# Patient Record
Sex: Female | Born: 1940 | Race: White | Hispanic: No | Marital: Married | Smoking: Never smoker
Health system: Southern US, Community
[De-identification: ages and names within clinical notes are randomized; demographics above are authoritative.]

## PROBLEM LIST (undated history)

## (undated) DIAGNOSIS — K219 Gastro-esophageal reflux disease without esophagitis: Secondary | ICD-10-CM

## (undated) DIAGNOSIS — I639 Cerebral infarction, unspecified: Secondary | ICD-10-CM

## (undated) DIAGNOSIS — Z86718 Personal history of other venous thrombosis and embolism: Secondary | ICD-10-CM

## (undated) DIAGNOSIS — I10 Essential (primary) hypertension: Secondary | ICD-10-CM

## (undated) DIAGNOSIS — M1990 Unspecified osteoarthritis, unspecified site: Secondary | ICD-10-CM

## (undated) HISTORY — PX: CYSTOSCOPY WITH STENT PLACEMENT: SHX5790

## (undated) HISTORY — PX: JOINT REPLACEMENT: SHX530

## (undated) HISTORY — PX: TONSILLECTOMY: SUR1361

## (undated) HISTORY — PX: OTHER SURGICAL HISTORY: SHX169

---

## 2006-11-04 ENCOUNTER — Inpatient Hospital Stay (HOSPITAL_COMMUNITY): Admission: RE | Admit: 2006-11-04 | Discharge: 2006-11-06 | Payer: Self-pay | Admitting: Orthopedic Surgery

## 2007-01-07 ENCOUNTER — Ambulatory Visit (HOSPITAL_COMMUNITY): Admission: RE | Admit: 2007-01-07 | Discharge: 2007-01-08 | Payer: Self-pay | Admitting: Orthopedic Surgery

## 2007-08-18 ENCOUNTER — Ambulatory Visit (HOSPITAL_COMMUNITY): Admission: RE | Admit: 2007-08-18 | Discharge: 2007-08-18 | Payer: Self-pay | Admitting: Orthopedic Surgery

## 2007-08-18 ENCOUNTER — Ambulatory Visit: Payer: Self-pay | Admitting: Surgery

## 2007-08-18 ENCOUNTER — Encounter (INDEPENDENT_AMBULATORY_CARE_PROVIDER_SITE_OTHER): Payer: Self-pay | Admitting: Orthopedic Surgery

## 2010-11-21 NOTE — Op Note (Signed)
NAMEMIEKA, Kari Flores              ACCOUNT NO.:  [id number]   MEDICAL RECORD NO.:  [id number]          PATIENT TYPE:  OIB   LOCATION:  0098                         FACILITY:  [facility]   PHYSICIAN:  Madlyn Frankel. Charlann Boxer, M.D.  DATE OF BIRTH:  1940/11/08   DATE OF PROCEDURE:  01/07/2007  DATE OF DISCHARGE:                               OPERATIVE REPORT   PREOPERATIVE DIAGNOSIS:  Stiff, painful, right total knee revision.   POSTOPERATIVE DIAGNOSIS:  Stiff, painful, right total knee revision.   OPERATION PERFORMED:  1. Examination under anesthesia indicating range of motion of 15 to 95      degrees.  2. Manipulation under anesthesia, right total knee replacement with a      post manipulation range of motion of 5 to 130, fairly easily.      Attempts at terminal extension not possible.   SURGEON:  Madlyn Frankel. Charlann Boxer, M.D.   ASSISTANT:  None.   ANESTHESIA:  Regional femoral block plus MAC,   COMPLICATIONS:  None.   INDICATIONS FOR PROCEDURE:  [name] is a very pleasant 70 year old  female who is now status post a revision, right total knee replacement  for diagnosis of stiffness.  Based on this significant extension issue  she had before, she underwent revision surgery.  At the time of surgery,  she was known have full knee extension and easy flexion with  manipulation.  Unfortunately in the postoperative period here she has  regained her postoperative flexion contracture as well as the inability  to fully flex the knee.  We reviewed the risks and benefits of this  current issue given the fact that she has been very diligent with her  home therapy and therapy on the outside.  The risks and benefits  discussed, consent obtained.   DESCRIPTION OF PROCEDURE:  The patient was brought to the operating  theater.  Once adequate anesthesia was established, the patient was  positioned on the operating table.  Examination under anesthesia  revealed the above findings.  Following this, I  manipulated the knee  first attempts in extension.  With the thigh placed in the supracondylar  region, I then used pressure on the calf in upward fashion.  I was able  to hear some lysis of adhesions posteriorly, was not significant.  I was  able to extend her to about lacking 5 degrees.  I then brought her hip  up to 90 degrees of flexion and with my hands just on the proximal and  distal aspect of her knee.  I manipulated her knee easily flexing  through scar tissue to 130 degrees.  The last bit of flexion 140 degrees  was not possible.  No other attempts now at terminal extension were  being made, I was to concerned of complications of fracture to push it  much further.  Procedure was ended at this point.  Patient was then  awakened from her anesthetic and brought to recovery room in stable  condition.  She will stay in the hospital overnight, when CPM is maxed  out and attend therapy tomorrow.  Medication is prescribed.  [name], M.D.  Electronically Signed     MDO/MEDQ  D:  01/07/2007  T:  01/08/2007  Job:  [id number]

## 2010-11-24 NOTE — Discharge Summary (Signed)
Kari Flores, Kari Flores              ACCOUNT NO.:  [id number]   MEDICAL RECORD NO.:  [id number]          PATIENT TYPE:  INP   LOCATION:  1615                         FACILITY:  [facility]   PHYSICIAN:  Madlyn Frankel. Charlann Boxer, M.D.  DATE OF BIRTH:  [birth year]   DATE OF ADMISSION:  11/04/2006  DATE OF DISCHARGE:  11/06/2006                               DISCHARGE SUMMARY   ADMITTING DIAGNOSES:  1. Osteoarthritis.  2. Status post right total knee replacement.  3. Deep venous thrombosis.  4. Hypertension.  5. Lacunar stroke in November2006.   DISCHARGE DIAGNOSES:  1. Osteoarthritis.  2. Status post right total knee replacement.  3. Deep venous thrombosis.  4. Hypertension.  5. Lacunar stroke in November2006.  6. Revision right total knee replacement with polyethylene exchange.   CONSULTS:  None.   PROCEDURE:  Revision right total knee polyethylene exchange with scar  debridement.   SURGEON:  Dr. Durene Romans.   ASSISTANT:  Dwyane Luo, PA-C.   HISTORY OF PRESENT ILLNESS:  [name] is a 70 year old female who  had a right total knee replacement in July2007, had subsequent  flexion contracture unresolved with physical therapy.  She walked with a  limp and had diminished quality of life.  She upon x-ray evaluation and  clinical evaluation was determined that a polyethylene exchange and  patella revision would help with motion of her right knee.   LABS:  Preadmission CBC showed hematocrit 41.1.  Postop day #1, 32.1.  Postop day #2, 30.6.   Preadmission coagulation INR 0.9.   Preadmission chemistries:  Sodium 142, potassium 4.4, glucose 101  tracked throughout.  No significant trending with glucose as postop day  #2 was at 106.   Preadmission GFR greater than 60, remained greater than 60 throughout.   Preadmission GI chemistries all within normal limits.   Preadmission UA showed trace hemoglobin.   EKG showed normal sinus rhythm.   RADIOLOGY:  Chest two-view no active  disease.   HOSPITAL COURSE:  The patient underwent revision surgery, tolerated the  procedure well, and was admitted to the orthopedic floor.  Pain was well  controlled throughout.  She remained afebrile throughout.  Her dressing  was changed postop day #2.  It was clean, dry, and intact.  She remained  neuromuscularly and vascularly intact.  After postop day #2 and physical  therapy, she had done quite well and was able to ambulate.  She was  ready for discharge home with a CPM and JAS splint.  Lovenox had been  started on postop day #1.  We will continue this on for another 2 weeks.  Home health was selected.  She progressed nicely as we had previously  anticipated.   DISCHARGE PHYSICAL THERAPY:  Maximized range of motion with CPM and JAS  splint and physical therapy.  She is weightbearing as tolerated.  Goals  of therapy is to minimize pain, maximize strength, maximize range of  motion, increase activities of daily living.   DISCHARGE WOUND CARE:  Keep wound dry and change dressing on a daily  basis.   DISCHARGE DIET:  Regular as tolerated  by the patient.   DISCHARGE MEDICATIONS:  1. Metoprolol 50 mg 2 p.o. q.a.m., 1 p.o. q.p.m.  2. Potassium chloride 10 mEq 1 p.o. b.i.d.  3. Hydrochlorothiazide 25 mg half tab 1 p.o. daily.  4. Crestor 5 mg 1 p.o. q.p.m.  5. Docusate sodium 100 mg 1 p.o. daily.  6. Lovenox 30 mg 1 subcu q.12 x14 days.  7. Aggrenox  25 mg 1 p.o. b.i.d. after Lovenox complete.  8. Robaxin 500 mg 1 p.o. q.6 muscle spasm.  9. Colace 100 mg 1 p.o. b.i.d.  10.Vicodin 5/325 one to two p.o. q.4-6 p.r.n. pain.   DISCHARGE SPECIAL INSTRUCTION:  1. Followup with Dr. Charlann Boxer in 2 weeks for wound check.  2. Utilize CPM at home.  3. JAS brace.  4. If the patient develops acute shortness of breath or severe calf      pain, please call Emergency Services immediately.    ______________________________  Yetta Glassman [address], [place]      Madlyn Frankel. Charlann Boxer, M.D.  Electronically  Signed   BLM/MEDQ  D:  11/19/2006  T:  11/19/2006  Job:  [id number]

## 2010-11-24 NOTE — H&P (Signed)
NAMEJAXON, Kari Flores              ACCOUNT NO.:  [id number]   MEDICAL RECORD NO.:  [id number]          PATIENT TYPE:  INP   LOCATION:  NA                           FACILITY:  [facility]   PHYSICIAN:  Madlyn Frankel. Charlann Boxer, M.D.  DATE OF BIRTH:  [birth year]   DATE OF ADMISSION:  11/04/2006  DATE OF DISCHARGE:                              HISTORY & PHYSICAL   PROCEDURE:  Right total knee revision, right total knee replacement,  polyethylene exchange with possible patella revision.   CHIEF COMPLAINT:  Right knee pain and stiffness.   HISTORY OF PRESENT ILLNESS:  This is a 70 year old female with a history  of right total knee replacement in July2007 with subsequent flexion  contracture which has been unresolved with ongoing physical therapy.  She became quite frustrated with this.  It has caused her to walk with a  limp and has diminished her quality of life.  She had been cleared  preoperatively by Dr. Lady Flores for revision, right total knee polyethylene  exchange with possible patella revision.   PAST MEDICAL HISTORY INCLUDES:  1. Osteoarthritis.  2. DVT.  3. Hypertension.  4. Lacunar stroke in November2006.   FAMILY HISTORY:  Heart disease, hypertension, diabetes.   SOCIAL HISTORY:  She is married to husband Kari Flores who will be primary  caregiver after the surgery.   DRUG ALLERGIES:  NO KNOWN DRUG ALLERGIES.   MEDICATIONS INCLUDE:  1. Crestor 5 mg one p.o. daily.  2. Hydrochlorothiazide 25 mg half daily.  3. Aggrenox.  4. Potassium chloride 10 mEq 2 p.o. daily.  5. Metoprolol ER 50 mg 1 p.o. daily.  6. __________ 2 q.a.m., 1 q.p.m.   REVIEW OF SYSTEMS:  MUSCULOSKELETAL:  Recent onset of some left shoulder  pain related to rotator cuff tendonitis.  Otherwise, see history of  present illness.   PHYSICAL EXAMINATION:  VITAL SIGNS:  Pulse 72, respirations 18, blood  pressure 138/74.  GENERAL:  She is awake, alert and oriented, well-developed, well-  nourished, in no acute distress.  NECK:  Supple.  No carotid bruits.  CHEST/LUNGS:  Clear to auscultation bilaterally.  BREASTS:  Deferred.  HEART:  Regular rate and rhythm without gallops, clicks, rubs or  murmurs.  ABDOMEN:  Soft, nontender, nondistended.  Bowel sounds present in all 4  quadrants.  GENITOURINARY:  Deferred.  EXTREMITIES:  Left upper extremity:  She has increased pain with forward  flexion related to rotator cuff.  Right lower extremity:  She has a 20  degrees flexion contracture, walks with a limp.  SKIN:  Right lower extremity has well-healed knee incision scar.  No  signs of cellulitis.  NEUROLOGIC:  She has intact distal sensibilities.   Labs, EKG, chest x-ray pending on presurgical testing on October 29, 2006.   IMPRESSION:  Right total knee replacement with flexion contracture.   PLAN OF ACTION:  Revision, right total knee replacement, polyethylene  exchange with possible femroal and patellar revision.  Risks and  complications were discussed.  Questions were encouraged, answered and  reviewed.     ______________________________  Kari Flores, [place]  [name], M.D.  Electronically Signed    BLM/MEDQ  D:  10/28/2006  T:  10/29/2006  Job:  [id number]

## 2010-11-24 NOTE — Op Note (Signed)
NAMEABBEGAIL, Kari Flores              ACCOUNT NO.:  [id number]   MEDICAL RECORD NO.:  [id number]          PATIENT TYPE:  INP   LOCATION:  0003                         [facility]ACILITY:  [facility]   PHYSICIAN:  Madlyn Frankel. Charlann Boxer, M.D.  DATE OF BIRTH:  10/19/1940   DATE OF PROCEDURE:  11/04/2006  DATE OF DISCHARGE:                               OPERATIVE REPORT   PREOPERATIVE DIAGNOSIS:  Painful stiff right total knee replacement.   POSTOPERATIVE DIAGNOSIS:  Same.   PROCEDURE:  1. Examination under anesthesia.  2. Manipulation under anesthesia.  3. Revision, right total knee polyethylene exchange with scar      debridement.   SURGEON:  Madlyn Frankel. Charlann Boxer, M.D.   ASSISTANT:  Dwyane Luo, PA-C   COMPONENTS USED:  A Triathlon knee system from [employer] that had a size 4  x 13 thickness polyethylene insert, posteriorly stabilized.  I downsized  it to a size 4 x 11-mm posterior stabilized insert.   ANESTHESIA:  General plus a preoperative femoral nerve block.   COMPLICATIONS:  None.   TOURNIQUET TIME:  25 minutes at 250 mmHg.   DRAINS:  None.   INDICATIONS:  Kari Flores is a very pleasant, 70 year old female with  a history of a right total knee replacement performed in July of 2007.  This was complicated by concerns for postoperative DVT resulting in  decreased physical therapy and subsequent loss of motion and persistent  pain postoperatively.  She had had no problems with wound healing and no  problems with any drainage, no fevers, chills, night sweats or  constitutional symptoms.  Her preoperative workup was negative for  infection or obvious loosening of components.  I discussed with her all  possible scenarios, including just a revision, scar debridement, all the  way to a femoral revision and removal of components if infection was  encountered.  We discussed the postoperative complications she had  before, reviewed all the risks and benefits, and consent was obtained.   PROCEDURE IN  DETAIL:  The patient was brought to the operative theater.  Once adequate anesthesia was established and preoperative antibiotics  administered, one gram of Ancef, the patient was positioned supine on  the operative table.  Examination under anesthesia revealed the same  findings I had seen in the office with a flexion contracture of 20  degrees and flexion limited only to 100-110 degrees.   At this point, following exam, a manipulation was carried out.  I was  very surprised with my findings.  With the manipulation I was able to  flex her back and leg adhesions that were audible and palpable.  There  was no complication, and I was able to flex at this point all the way  back to 130 to 140 degrees with a cast on her hamstring fairly easily.  I was unable to change significantly her extension or flexion  contracture but only about 5 degrees.   At this point, thigh tourniquet was placed to the right lower extremity  that was pre-scrubbed and prepped and draped in a sterile fashion.  The  patient's previous curvilinear anterior incision was  utilized.  Soft  tissue flaps were created, identifying the extensor mechanism.  I then  opened the knee with a modified parapatellar and median parapatellar  arthrotomy without patellar eversion, just subluxation.  Extensive scar  debridement carried out including the medial, lateral and suprapatellar  pouch regions.   I debrided the soft tissues in the infrapatellar and suprapatellar  regions and evaluated the patellar thickness.  Initially there was some  concern based on the size of this component that this may be  overstuffed.  Again, I had already manipulated the patient to 140  degrees, and her patellar thickness probably was going to be okay and  add to the fulcrum for quadriceps strength.  It measured 26-27 mm in  thickness.   Given this, despite the fact that it may be a couple of millimeters, 2  to 3 mm, thick, I did not want to revise  this for concern of the  remaining bone stock and potential complications associated with it.   With further exposure and debridement including proximal and medial  peel, I was able to expose the knee.  The old polyethylene was removed.  Components were examined, found to be cemented in position without  obvious loosening.  I was then able to debride some posterior tissues.   Trial reduction was then carried out following this debridement with a  size 11 insert.  At this point, when this had been done, the knee came  out to full extension without any difficulty at all and with no pressure  on the femur.  Her ligaments remained stable from extension to flexion.  Given all these parameters, I was very happy with this portion of the  procedure.  I thus opened a size 4 x 11-mm thick posterior stabilized  insert from [employer].  At this point, the knee was irrigated with  approximately 1500 mL of fluid, pulse lavage.  The final insert was then  impacted onto the completely visualized tray without difficulty.  The  knee was reduced.  At this point, the tourniquet was let down.  I  injected the knee with 60 mL of 0.25% Marcaine with epinephrine and  Toradol.  In addition, obtained hemostasis although this was difficult  due to her synovectomy that was carried out and scar debridement.  I  used 10 mL of FloSeal in the suprapatellar and medial and lateral  gutters.   At this point, no Hemovac drain was placed.  The knee was brought up to  flexion.  I then reapproximated the extensor mechanism using #1 Vicryl  with the knee in flexion.  The remaining wound was closed with 2-0  Vicryl and staples on the skin. Skin was cleaned, dried and dressed  sterilely with Xeroform dressing, sponges and a bulky wrap.  She was  brought to the recovery room in stable condition.   Given the results that we found on the operative table, I would expect that Kari Flores will have a very good result at this  point.  I am  going to place her onto immediate CPM use from 0 to 90 with advancement  as tolerated, 10 degrees.  I am going to discharge her with a CPM to  optimize her result.  Given the fact that she came out fully straight, I  may or may not use a GIF splint on her knee to maximize her full  recovery of strength and extension.      [name], M.D.  Electronically Signed  MDO/MEDQ  D:  11/04/2006  T:  11/04/2006  Job:  [id number]

## 2011-04-24 LAB — BASIC METABOLIC PANEL
Calcium: 9.8
Chloride: 104
Creatinine, Ser: 0.66

## 2011-04-24 LAB — HEMOGLOBIN AND HEMATOCRIT, BLOOD: Hemoglobin: 13.9

## 2014-04-05 ENCOUNTER — Other Ambulatory Visit (HOSPITAL_COMMUNITY): Payer: Self-pay | Admitting: *Deleted

## 2014-04-05 NOTE — Progress Notes (Signed)
02/23/2014-Medical clearance from Dr. Tami Ribas  on chart. 03/04/2014-EKG and CXR from [facility] on chart.

## 2014-04-05 NOTE — Patient Instructions (Addendum)
20     Your procedure is scheduled on:  Monday 04/12/2014  Report to [facility] Main Entrance and follow signs to Short Stay  at 0530 AM.  Call this number if you have problems the night before or morning of surgery:   [phone]   Remember:          Do not eat food or drink liquids AFTER MIDNIGHT!  Take these medicines the morning of surgery with A SIP OF WATER: Metoprolol    [facility] IS NOT RESPONSIBLE FOR ANY BELONGINGS OR VALUABLES BROUGHT TO HOSPITAL.  Marland Kitchen  Leave suitcase in the car. After surgery it may be brought to your room.  For patients admitted to the hospital, checkout time is 11:00 AM the day of              Discharge.    DO NOT WEAR  JEWELRY,MAKE-UP,LOTIONS,POWDERS,PERFUMES,CONTACTS , DENTURES OR BRIDGEWORK ,AND DO NOT WEAR FALSE EYELASHES                                    Patients discharged the day of surgery will not be allowed to drive home.   If going home the same day of surgery, must have someone stay with you   first 24 hrs.at home and arrange for someone to drive you home from the Hospital.                           YOUR DRIVER IS:N/A   Special Instructions:              Please read over the following fact sheets that you were given:             1. Kari PREPARING FOR SURGERY SHEET              2.INCENTIVE SPIROMETRY                                                        [facility] - Preparing for Surgery Before surgery, you can play an important role.  Because skin is not sterile, your skin needs to be as free of germs as possible.  You can reduce the number of germs on your skin by washing with CHG (chlorahexidine gluconate) soap before surgery.  CHG is an antiseptic cleaner which kills germs and bonds with the skin to continue killing germs even after washing. Please DO NOT use if you have an allergy to CHG or antibacterial soaps.  If your skin becomes reddened/irritated stop using the CHG and inform your nurse when you arrive at Short  Stay. Do not shave (including legs and underarms) for at least 48 hours prior to the first CHG shower.  You may shave your face/neck. Please follow these instructions carefully:  1.  Shower with CHG Soap the night before surgery and the  morning of Surgery.  2.  If you choose to wash your hair, wash your hair first as usual with your  normal  shampoo.  3.  After you shampoo, rinse your hair and body thoroughly to remove the  shampoo.  4.  Use CHG as you would any other liquid soap.  You can apply chg directly  to the skin and wash                       Gently with a scrungie or clean washcloth.  5.  Apply the CHG Soap to your body ONLY FROM THE NECK DOWN.   Do not use on face/ open                           Wound or open sores. Avoid contact with eyes, ears mouth and genitals (private parts).                       Wash face,  Genitals (private parts) with your normal soap.             6.  Wash thoroughly, paying special attention to the area where your surgery  will be performed.  7.  Thoroughly rinse your body with warm water from the neck down.  8.  DO NOT shower/wash with your normal soap after using and rinsing off  the CHG Soap.                9.  Pat yourself dry with a clean towel.            10.  Wear clean pajamas.            11.  Place clean sheets on your bed the night of your first shower and do not  sleep with pets. Day of Surgery : Do not apply any lotions/deodorants the morning of surgery.  Please wear clean clothes to the hospital/surgery center.  FAILURE TO FOLLOW THESE INSTRUCTIONS MAY RESULT IN THE CANCELLATION OF YOUR SURGERY PATIENT SIGNATURE_________________________________  NURSE SIGNATURE__________________________________  ________________________________________________________________________   [facility]  An incentive spirometer is a tool that can help keep your lungs clear and active. This tool measures how well you are  filling your lungs with each breath. Taking long deep breaths may help reverse or decrease the chance of developing breathing (pulmonary) problems (especially infection) following:  A long period of time when you are unable to move or be active. BEFORE THE PROCEDURE   If the spirometer includes an indicator to show your best effort, your nurse or respiratory therapist will set it to a desired goal.  If possible, sit up straight or lean slightly forward. Try not to slouch.  Hold the incentive spirometer in an upright position. INSTRUCTIONS FOR USE  1. Sit on the edge of your bed if possible, or sit up as far as you can in bed or on a chair. 2. Hold the incentive spirometer in an upright position. 3. Breathe out normally. 4. Place the mouthpiece in your mouth and seal your lips tightly around it. 5. Breathe in slowly and as deeply as possible, raising the piston or the ball toward the top of the column. 6. Hold your breath for 3-5 seconds or for as long as possible. Allow the piston or ball to fall to the bottom of the column. 7. Remove the mouthpiece from your mouth and breathe out normally. 8. Rest for a few seconds and repeat Steps 1 through 7 at least 10 times every 1-2 hours when you are awake. Take your time and take a few normal breaths between deep breaths. 9. The spirometer may include an indicator to show  your best effort. Use the indicator as a goal to work toward during each repetition. 10. After each set of 10 deep breaths, practice coughing to be sure your lungs are clear. If you have an incision (the cut made at the time of surgery), support your incision when coughing by placing a pillow or rolled up towels firmly against it. Once you are able to get out of bed, walk around indoors and cough well. You may stop using the incentive spirometer when instructed by your caregiver.  RISKS AND COMPLICATIONS  Take your time so you do not get dizzy or light-headed.  If you are in pain,  you may need to take or ask for pain medication before doing incentive spirometry. It is harder to take a deep breath if you are having pain. AFTER USE  Rest and breathe slowly and easily.  It can be helpful to keep track of a log of your progress. Your caregiver can provide you with a simple table to help with this. If you are using the spirometer at home, follow these instructions: [place] IF:   You are having difficultly using the spirometer.  You have trouble using the spirometer as often as instructed.  Your pain medication is not giving enough relief while using the spirometer.  You develop fever of 100.5 F (38.1 C) or higher. SEEK IMMEDIATE MEDICAL CARE IF:   You cough up bloody sputum that had not been present before.  You develop fever of 102 F (38.9 C) or greater.  You develop worsening pain at or near the incision site. MAKE SURE YOU:   Understand these instructions.  Will watch your condition.  Will get help right away if you are not doing well or get worse. Document Released: 11/05/2006 Document Revised: 09/17/2011 Document Reviewed: 01/06/2007 Exit[employer] Patient Information 2014 Exit[employer], [place].   ________________________________________________________________________  WHAT IS A BLOOD TRANSFUSION? Blood Transfusion Information  A transfusion is the replacement of blood or some of its parts. Blood is made up of multiple cells which provide different functions.  Red blood cells carry oxygen and are used for blood loss replacement.  White blood cells fight against infection.  Platelets control bleeding.  Plasma helps clot blood.  Other blood products are available for specialized needs, such as hemophilia or other clotting disorders. BEFORE THE TRANSFUSION  Who gives blood for transfusions?   Healthy volunteers who are fully evaluated to make sure their blood is safe. This is blood bank blood. Transfusion therapy is the safest it has ever  been in the practice of medicine. Before blood is taken from a donor, a complete history is taken to make sure that person has no history of diseases nor engages in risky social behavior (examples are intravenous drug use or sexual activity with multiple partners). The donor's travel history is screened to minimize risk of transmitting infections, such as malaria. The donated blood is tested for signs of infectious diseases, such as HIV and hepatitis. The blood is then tested to be sure it is compatible with you in order to minimize the chance of a transfusion reaction. If you or a relative donates blood, this is often done in anticipation of surgery and is not appropriate for emergency situations. It takes many days to process the donated blood. RISKS AND COMPLICATIONS Although transfusion therapy is very safe and saves many lives, the main dangers of transfusion include:   Getting an infectious disease.  Developing a transfusion reaction. This is an allergic reaction to  something in the blood you were given. Every precaution is taken to prevent this. The decision to have a blood transfusion has been considered carefully by your caregiver before blood is given. Blood is not given unless the benefits outweigh the risks. AFTER THE TRANSFUSION  Right after receiving a blood transfusion, you will usually feel much better and more energetic. This is especially true if your red blood cells have gotten low (anemic). The transfusion raises the level of the red blood cells which carry oxygen, and this usually causes an energy increase.  The nurse administering the transfusion will monitor you carefully for complications. HOME CARE INSTRUCTIONS  No special instructions are needed after a transfusion. You may find your energy is better. Speak with your caregiver about any limitations on activity for underlying diseases you may have. SEEK MEDICAL CARE IF:   Your condition is not improving after your  transfusion.  You develop redness or irritation at the intravenous (IV) site. SEEK IMMEDIATE MEDICAL CARE IF:  Any of the following symptoms occur over the next 12 hours:  Shaking chills.  You have a temperature by mouth above 102 F (38.9 C), not controlled by medicine.  Chest, back, or muscle pain.  People around you feel you are not acting correctly or are confused.  Shortness of breath or difficulty breathing.  Dizziness and fainting.  You get a rash or develop hives.  You have a decrease in urine output.  Your urine turns a dark color or changes to pink, red, or brown. Any of the following symptoms occur over the next 10 days:  You have a temperature by mouth above 102 F (38.9 C), not controlled by medicine.  Shortness of breath.  Weakness after normal activity.  The white part of the eye turns yellow (jaundice).  You have a decrease in the amount of urine or are urinating less often.  Your urine turns a dark color or changes to pink, red, or brown. Document Released: 06/22/2000 Document Revised: 09/17/2011 Document Reviewed: 02/09/2008 [facility] Patient Information 2014 [place], [place].  _______________________________________________________________________

## 2014-04-06 ENCOUNTER — Encounter (HOSPITAL_COMMUNITY): Payer: Self-pay | Admitting: Pharmacy Technician

## 2014-04-06 ENCOUNTER — Encounter (HOSPITAL_COMMUNITY): Payer: Self-pay

## 2014-04-06 ENCOUNTER — Encounter (HOSPITAL_COMMUNITY)
Admission: RE | Admit: 2014-04-06 | Discharge: 2014-04-06 | Disposition: A | Discharge status: Home / Self Care | Payer: Medicare Other | Source: Ambulatory Visit | Attending: Orthopedic Surgery | Admitting: Orthopedic Surgery

## 2014-04-06 DIAGNOSIS — Z96659 Presence of unspecified artificial knee joint: Secondary | ICD-10-CM | POA: Insufficient documentation

## 2014-04-06 DIAGNOSIS — T84099A Other mechanical complication of unspecified internal joint prosthesis, initial encounter: Secondary | ICD-10-CM | POA: Insufficient documentation

## 2014-04-06 DIAGNOSIS — Z01812 Encounter for preprocedural laboratory examination: Secondary | ICD-10-CM | POA: Insufficient documentation

## 2014-04-06 HISTORY — DX: Essential (primary) hypertension: I10

## 2014-04-06 HISTORY — DX: Gastro-esophageal reflux disease without esophagitis: K21.9

## 2014-04-06 HISTORY — DX: Unspecified osteoarthritis, unspecified site: M19.90

## 2014-04-06 HISTORY — DX: Personal history of other venous thrombosis and embolism: Z86.718

## 2014-04-06 HISTORY — DX: Cerebral infarction, unspecified: I63.9

## 2014-04-06 LAB — CBC
HEMATOCRIT: 42 % (ref 36.0–46.0)
HEMOGLOBIN: 14 g/dL (ref 12.0–15.0)
MCH: 30.4 pg (ref 26.0–34.0)
MCHC: 33.3 g/dL (ref 30.0–36.0)
MCV: 91.1 fL (ref 78.0–100.0)
Platelets: 244 10*3/uL (ref 150–400)
RBC: 4.61 MIL/uL (ref 3.87–5.11)
RDW: 13.3 % (ref 11.5–15.5)
WBC: 4.9 10*3/uL (ref 4.0–10.5)

## 2014-04-06 LAB — BASIC METABOLIC PANEL
Anion gap: 13 (ref 5–15)
BUN: 15 mg/dL (ref 6–23)
CO2: 26 meq/L (ref 19–32)
Calcium: 9.5 mg/dL (ref 8.4–10.5)
Chloride: 100 mEq/L (ref 96–112)
Creatinine, Ser: 0.74 mg/dL (ref 0.50–1.10)
GFR calc Af Amer: >90 mL/min (ref >90)
GFR calc non Af Amer: 84 mL/min — ABNORMAL LOW (ref >90)
GLUCOSE: 102 mg/dL — AB (ref 70–99)
POTASSIUM: 4.5 meq/L (ref 3.7–5.3)
Sodium: 139 mEq/L (ref 137–147)

## 2014-04-06 LAB — URINE MICROSCOPIC-ADD ON

## 2014-04-06 LAB — SURGICAL PCR SCREEN
MRSA, PCR: NEGATIVE
Staphylococcus aureus: NEGATIVE

## 2014-04-06 LAB — URINALYSIS, ROUTINE W REFLEX MICROSCOPIC
Bilirubin Urine: NEGATIVE
Glucose, UA: NEGATIVE mg/dL
Hgb urine dipstick: NEGATIVE
Ketones, ur: NEGATIVE mg/dL
Nitrite: NEGATIVE
PROTEIN: NEGATIVE mg/dL
Specific Gravity, Urine: 1.014 (ref 1.005–1.030)
Urobilinogen, UA: 0.2 mg/dL (ref 0.0–1.0)
pH: 5 (ref 5.0–8.0)

## 2014-04-06 LAB — APTT: aPTT: 29 seconds (ref 24–37)

## 2014-04-06 LAB — PROTIME-INR
INR: 0.97 (ref 0.00–1.49)
Prothrombin Time: 12.9 seconds (ref 11.6–15.2)

## 2014-04-07 NOTE — Progress Notes (Signed)
03/16/2014-Office note for medical clearance [facility]

## 2014-04-08 NOTE — Progress Notes (Signed)
Received Fax from Dr. Nilsa Nuttinglin's office that prescription for Cipro was called in for Patient. Fax placed in chart.

## 2014-04-10 NOTE — H&P (Signed)
TOTAL KNEE REVISION ADMISSION H&P  Patient is being admitted for right revision of tibial component vs total knee arthroplasty.  Subjective:  Chief Complaint:   Right knee pain s/p total knee arthroplasty  HPI: Kari Flores, 73 y.o. female, has a history of pain and functional disability in the right knee(s) due to failed previous arthroplasty (tibial component) and patient has failed non-surgical conservative treatments for greater than 12 weeks to include NSAID's and/or analgesics, flexibility and strengthening excercises, supervised PT with diminished ADL's post treatment, use of assistive devices and activity modification. The indications for the revision of the total knee arthroplasty are loosening of one or more components. Onset of symptoms was gradual starting months ago with rapidlly worsening course since that time.  Prior procedures on the right knee(s) include arthroplasty and revision knee surgery (11/04/2006) per Dr. Charlann Boxer.  Patient currently rates pain in the right knee(s) at 9-10 out of 10 with activity. There is night pain, worsening of pain with activity and weight bearing, pain that interferes with activities of daily living, pain with passive range of motion and joint swelling.  Patient has evidence of prosthetic loosening by imaging studies. This condition presents safety issues increasing the risk of falls.  There is no current active infection.  Risks, benefits and expectations were discussed with the patient.  Risks including but not limited to the risk of anesthesia, blood clots, nerve damage, blood vessel damage, failure of the prosthesis, infection and up to and including death.  Patient understand the risks, benefits and expectations and wishes to proceed with surgery.   PREVIOUS COMPONENTS USED:   Triathlon knee system from [employer]  Downsized to a size 4 x 11-mm posterior stabilized insert during last revision.   PCP: Court Joy, PA-C  D/C Plans:       Home with HHPT  Post-op Meds:       No Rx given  Tranexamic Acid:      To be given - topically  Decadron:      Is to be given  FYI:     Lovenox and Aggrenox per PCP (previous stroke and blood clots  Norco post-op   Past Medical History  Diagnosis Date  . Hypertension   . Arthritis   . GERD (gastroesophageal reflux disease)   . H/O blood clots     after knee surgery -1 st surgery right knee-in lower leg  . Stroke     no deficits-on Aggrenox- sometime before first knee surgery    Past Surgical History  Procedure Laterality Date  . Joint replacement      right knee  . 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 Cystoscopy with stent placement      years ago  . Tonsillectomy      No prescriptions prior to admission   No Known Allergies   History  Substance Use Topics  . Smoking status: Never Smoker   . Smokeless tobacco: Not on file  . Alcohol Use: Yes     Comment: socially      Review of Systems  Constitutional: Negative.   HENT: Negative.   Eyes: Negative.   Respiratory: Negative.   Cardiovascular: Negative.   Gastrointestinal: Positive for heartburn.  Genitourinary: Negative.   Musculoskeletal: Positive for joint pain.  Skin: Negative.   Neurological: Negative.   Endo/Heme/Allergies: Negative.   Psychiatric/Behavioral: Negative.      Objective:  Physical Exam  Constitutional: She is oriented to person, place, and time.  She appears well-developed and well-nourished.  HENT:  Head: Normocephalic and atraumatic.  Eyes: Pupils are equal, round, and reactive to light.  Neck: Neck supple. No JVD present. No tracheal deviation present. No thyromegaly present.  Cardiovascular: Normal rate, regular rhythm, normal heart sounds and intact distal pulses.   Respiratory: Effort normal and breath sounds normal. No stridor. No respiratory distress. She has no wheezes.  GI: Soft. There is no tenderness. There is no guarding.  Musculoskeletal:       Right knee: She  exhibits decreased range of motion, swelling, laceration (previous incision healed) and bony tenderness. She exhibits no ecchymosis, no deformity, no erythema and normal alignment. Tenderness found.  Lymphadenopathy:    She has no cervical adenopathy.  Neurological: She is alert and oriented to person, place, and time.  Skin: Skin is warm and dry.  Psychiatric: She has a normal mood and affect.     Imaging Review Plain radiographs demonstrate previous TKA of the right knee(s). The overall alignment is neutral.There is evidence of loosening of the tibial components. The bone quality appears to be good for age and reported activity level.   Assessment/Plan:  Right knee(s) with failed previous arthroplasty.   The patient history, physical examination, clinical judgment of the provider and imaging studies are consistent with previous arthroplasty of the right knee. Revision total knee arthroplasty is deemed medically necessary. The treatment options including medical management, injection therapy, arthroscopy and revision arthroplasty were discussed at length. The risks and benefits of revision total knee arthroplasty were presented and reviewed. The risks due to aseptic loosening, infection, stiffness, patella tracking problems, thromboembolic complications and other imponderables were discussed. The patient acknowledged the explanation, agreed to proceed with the plan and consent was signed. Patient is being admitted for inpatient treatment for surgery, pain control, PT, OT, prophylactic antibiotics, VTE prophylaxis, progressive ambulation and ADL's and discharge planning.The patient is planning to be discharged home with home health services.    Anastasio AuerbachMatthew S. Kinslei Labine   PA-C  04/10/2014, 8:10 PM

## 2014-04-11 NOTE — Anesthesia Preprocedure Evaluation (Addendum)
Anesthesia Evaluation  Patient identified by MRN, date of birth, ID band Patient awake    Reviewed: Allergy & Precautions, H&P , NPO status , Patient's Chart, lab work & pertinent test results, reviewed documented beta blocker date and time   Airway Mallampati: II TM Distance: >3 FB Neck ROM: full    Dental no notable dental hx. (+) Teeth Intact, Dental Advisory Given   Pulmonary neg pulmonary ROS,  breath sounds clear to auscultation  Pulmonary exam normal       Cardiovascular Exercise Tolerance: Good hypertension, Pt. on home beta blockers and Pt. on medications negative cardio ROS  Rhythm:regular Rate:Normal     Neuro/Psych Embolic stroke from blood clot after first knee surgery. On Aggrenox CVA, No Residual Symptoms negative neurological ROS  negative psych ROS   GI/Hepatic negative GI ROS, Neg liver ROS,   Endo/Other  negative endocrine ROS  Renal/GU negative Renal ROS  negative genitourinary   Musculoskeletal  (+) Arthritis -, Osteoarthritis,    Abdominal   Peds  Hematology negative hematology ROS (+)   Anesthesia Other Findings   Reproductive/Obstetrics negative OB ROS                         Anesthesia Physical Anesthesia Plan  ASA: III  Anesthesia Plan: Spinal   Post-op Pain Management:    Induction:   Airway Management Planned:   Additional Equipment:   Intra-op Plan:   Post-operative Plan:   Informed Consent: I have reviewed the patients History and Physical, chart, labs and discussed the procedure including the risks, benefits and alternatives for the proposed anesthesia with the patient or authorized representative who has indicated his/her understanding and acceptance.   Dental Advisory Given  Plan Discussed with: CRNA and Surgeon  Anesthesia Plan Comments:        Anesthesia Quick Evaluation

## 2014-04-12 ENCOUNTER — Encounter (HOSPITAL_COMMUNITY): Payer: Self-pay | Admitting: *Deleted

## 2014-04-12 ENCOUNTER — Encounter (HOSPITAL_COMMUNITY)
Admission: RE | Disposition: A | Discharge status: Home Health | Payer: Self-pay | Source: Ambulatory Visit | Attending: Orthopedic Surgery

## 2014-04-12 ENCOUNTER — Inpatient Hospital Stay (HOSPITAL_COMMUNITY)
Admission: RE | Admit: 2014-04-12 | Discharge: 2014-04-13 | DRG: 470 | Disposition: A | Discharge status: Home Health | Payer: Medicare Other | Source: Ambulatory Visit | Attending: Orthopedic Surgery | Admitting: Orthopedic Surgery

## 2014-04-12 ENCOUNTER — Inpatient Hospital Stay (HOSPITAL_COMMUNITY): Payer: Medicare Other | Admitting: Anesthesiology

## 2014-04-12 ENCOUNTER — Encounter (HOSPITAL_COMMUNITY): Payer: Medicare Other | Admitting: Anesthesiology

## 2014-04-12 DIAGNOSIS — T84032A Mechanical loosening of internal right knee prosthetic joint, initial encounter: Secondary | ICD-10-CM | POA: Diagnosis present

## 2014-04-12 DIAGNOSIS — Z96659 Presence of unspecified artificial knee joint: Secondary | ICD-10-CM

## 2014-04-12 DIAGNOSIS — Z01812 Encounter for preprocedural laboratory examination: Secondary | ICD-10-CM

## 2014-04-12 DIAGNOSIS — T84092A Other mechanical complication of internal right knee prosthesis, initial encounter: Principal | ICD-10-CM | POA: Diagnosis present

## 2014-04-12 DIAGNOSIS — I10 Essential (primary) hypertension: Secondary | ICD-10-CM | POA: Diagnosis present

## 2014-04-12 DIAGNOSIS — Y838 Other surgical procedures as the cause of abnormal reaction of the patient, or of later complication, without mention of misadventure at the time of the procedure: Secondary | ICD-10-CM | POA: Diagnosis present

## 2014-04-12 DIAGNOSIS — Z96651 Presence of right artificial knee joint: Secondary | ICD-10-CM

## 2014-04-12 DIAGNOSIS — Z8673 Personal history of transient ischemic attack (TIA), and cerebral infarction without residual deficits: Secondary | ICD-10-CM

## 2014-04-12 DIAGNOSIS — M25561 Pain in right knee: Secondary | ICD-10-CM | POA: Diagnosis present

## 2014-04-12 HISTORY — PX: TOTAL KNEE REVISION: SHX996

## 2014-04-12 LAB — TYPE AND SCREEN
ABO/RH(D): O POS
Antibody Screen: NEGATIVE

## 2014-04-12 LAB — CREATININE, SERUM
CREATININE: 0.75 mg/dL (ref 0.50–1.10)
GFR calc non Af Amer: 83 mL/min — ABNORMAL LOW (ref >90)

## 2014-04-12 LAB — CBC
HCT: 37.3 % (ref 36.0–46.0)
Hemoglobin: 12.5 g/dL (ref 12.0–15.0)
MCH: 30.7 pg (ref 26.0–34.0)
MCHC: 33.5 g/dL (ref 30.0–36.0)
MCV: 91.6 fL (ref 78.0–100.0)
Platelets: 198 10*3/uL (ref 150–400)
RBC: 4.07 MIL/uL (ref 3.87–5.11)
RDW: 13.1 % (ref 11.5–15.5)
WBC: 12 10*3/uL — ABNORMAL HIGH (ref 4.0–10.5)

## 2014-04-12 SURGERY — TOTAL KNEE REVISION
Anesthesia: Spinal | Site: Knee | Laterality: Right

## 2014-04-12 MED ORDER — CHLORHEXIDINE GLUCONATE 4 % EX LIQD: 60.0000 mL | Freq: Once | CUTANEOUS | Status: DC

## 2014-04-12 MED ORDER — FENTANYL CITRATE 0.05 MG/ML IJ SOLN
INTRAMUSCULAR | Status: DC | PRN
  Administered 2014-04-12 (×2): 50 ug via INTRAVENOUS

## 2014-04-12 MED ORDER — PHENYLEPHRINE-DM-GG 2.5-5-100 MG/5ML PO LIQD: 5.0000 mL | Freq: Once | ORAL | Status: DC | PRN

## 2014-04-12 MED ORDER — HYDROMORPHONE HCL 1 MG/ML IJ SOLN
0.5000 mg | INTRAMUSCULAR | Status: DC | PRN
Start: 2014-04-12 — End: 2014-04-14

## 2014-04-12 MED ORDER — AMLODIPINE BESYLATE 2.5 MG PO TABS
2.5000 mg | ORAL_TABLET | Freq: Every day | ORAL | Status: DC
  Administered 2014-04-12: 2.5 mg via ORAL
  Filled 2014-04-12 (×2): qty 1

## 2014-04-12 MED ORDER — SODIUM CHLORIDE 0.9 % IV SOLN
INTRAVENOUS | Status: DC
  Administered 2014-04-12 (×2): via INTRAVENOUS
  Filled 2014-04-12 (×10): qty 1000

## 2014-04-12 MED ORDER — LACTATED RINGERS IV SOLN: INTRAVENOUS | Status: DC

## 2014-04-12 MED ORDER — MENTHOL 3 MG MT LOZG: 1.0000 | LOZENGE | OROMUCOSAL | Status: DC | PRN

## 2014-04-12 MED ORDER — SODIUM CHLORIDE 0.9 % IJ SOLN
INTRAMUSCULAR | Status: DC | PRN
  Administered 2014-04-12: 9 mL

## 2014-04-12 MED ORDER — NEOSTIGMINE METHYLSULFATE 10 MG/10ML IV SOLN
INTRAVENOUS | Status: AC
Start: 2014-04-12 — End: 2014-04-12
  Filled 2014-04-12: qty 1

## 2014-04-12 MED ORDER — BUPIVACAINE-EPINEPHRINE 0.25% -1:200000 IJ SOLN
INTRAMUSCULAR | Status: DC | PRN
  Administered 2014-04-12: 30 mL

## 2014-04-12 MED ORDER — HYDROCODONE-ACETAMINOPHEN 7.5-325 MG PO TABS
1.0000 | ORAL_TABLET | ORAL | Status: DC
  Administered 2014-04-12 – 2014-04-13 (×8): 1 via ORAL
  Filled 2014-04-12 (×8): qty 1

## 2014-04-12 MED ORDER — METHOCARBAMOL 500 MG PO TABS
500.0000 mg | ORAL_TABLET | Freq: Four times a day (QID) | ORAL | Status: DC | PRN
  Administered 2014-04-13 (×2): 500 mg via ORAL
  Filled 2014-04-12 (×2): qty 1

## 2014-04-12 MED ORDER — BUPIVACAINE-EPINEPHRINE (PF) 0.25% -1:200000 IJ SOLN
INTRAMUSCULAR | Status: AC
  Filled 2014-04-12: qty 30

## 2014-04-12 MED ORDER — MAGNESIUM CITRATE PO SOLN: 1.0000 | Freq: Once | ORAL | Status: AC | PRN

## 2014-04-12 MED ORDER — STERILE WATER FOR IRRIGATION IR SOLN
Status: DC | PRN
  Administered 2014-04-12: 1500 mL

## 2014-04-12 MED ORDER — BUPIVACAINE LIPOSOME 1.3 % IJ SUSP
20.0000 mL | Freq: Once | INTRAMUSCULAR | Status: AC
  Administered 2014-04-12: 20 mL
  Filled 2014-04-12: qty 20

## 2014-04-12 MED ORDER — ONDANSETRON HCL 4 MG/2ML IJ SOLN: 4.0000 mg | Freq: Four times a day (QID) | INTRAMUSCULAR | Status: DC | PRN

## 2014-04-12 MED ORDER — 0.9 % SODIUM CHLORIDE (POUR BTL) OPTIME
TOPICAL | Status: DC | PRN
  Administered 2014-04-12: 1000 mL

## 2014-04-12 MED ORDER — ROCURONIUM BROMIDE 100 MG/10ML IV SOLN
INTRAVENOUS | Status: DC | PRN
  Administered 2014-04-12: 40 mg via INTRAVENOUS

## 2014-04-12 MED ORDER — METOPROLOL SUCCINATE ER 50 MG PO TB24
50.0000 mg | ORAL_TABLET | Freq: Every day | ORAL | Status: DC
  Administered 2014-04-12: 50 mg via ORAL
  Filled 2014-04-12 (×2): qty 1

## 2014-04-12 MED ORDER — GLYCOPYRROLATE 0.2 MG/ML IJ SOLN
INTRAMUSCULAR | Status: DC | PRN
  Administered 2014-04-12: 0.4 mg via INTRAVENOUS

## 2014-04-12 MED ORDER — ALUM & MAG HYDROXIDE-SIMETH 200-200-20 MG/5ML PO SUSP
30.0000 mL | ORAL | Status: DC | PRN
  Administered 2014-04-12 – 2014-04-13 (×3): 30 mL via ORAL
  Filled 2014-04-12 (×3): qty 30

## 2014-04-12 MED ORDER — DEXAMETHASONE SODIUM PHOSPHATE 10 MG/ML IJ SOLN
10.0000 mg | Freq: Once | INTRAMUSCULAR | Status: AC
  Administered 2014-04-13: 10 mg via INTRAVENOUS
  Filled 2014-04-12: qty 1

## 2014-04-12 MED ORDER — ONDANSETRON HCL 4 MG/2ML IJ SOLN
INTRAMUSCULAR | Status: AC
  Filled 2014-04-12: qty 2

## 2014-04-12 MED ORDER — POLYETHYLENE GLYCOL 3350 17 G PO PACK
17.0000 g | PACK | Freq: Two times a day (BID) | ORAL | Status: DC
  Administered 2014-04-13: 17 g via ORAL

## 2014-04-12 MED ORDER — DEXAMETHASONE SODIUM PHOSPHATE 10 MG/ML IJ SOLN
INTRAMUSCULAR | Status: AC
  Filled 2014-04-12: qty 1

## 2014-04-12 MED ORDER — ROPIVACAINE HCL 5 MG/ML IJ SOLN
INTRAMUSCULAR | Status: AC
  Filled 2014-04-12: qty 30

## 2014-04-12 MED ORDER — DOCUSATE SODIUM 100 MG PO CAPS
100.0000 mg | ORAL_CAPSULE | Freq: Two times a day (BID) | ORAL | Status: DC
  Administered 2014-04-12 – 2014-04-13 (×2): 100 mg via ORAL

## 2014-04-12 MED ORDER — LACTATED RINGERS IV SOLN
INTRAVENOUS | Status: DC | PRN
  Administered 2014-04-12 (×2): via INTRAVENOUS

## 2014-04-12 MED ORDER — METOPROLOL SUCCINATE ER 100 MG PO TB24
100.0000 mg | ORAL_TABLET | Freq: Every day | ORAL | Status: DC
  Administered 2014-04-13: 100 mg via ORAL
  Filled 2014-04-12: qty 1

## 2014-04-12 MED ORDER — MIDAZOLAM HCL 5 MG/5ML IJ SOLN
INTRAMUSCULAR | Status: DC | PRN
  Administered 2014-04-12 (×2): 1 mg via INTRAVENOUS

## 2014-04-12 MED ORDER — CEFAZOLIN SODIUM-DEXTROSE 2-3 GM-% IV SOLR
2.0000 g | Freq: Four times a day (QID) | INTRAVENOUS | Status: AC
  Administered 2014-04-12 (×2): 2 g via INTRAVENOUS
  Filled 2014-04-12 (×2): qty 50

## 2014-04-12 MED ORDER — HYDROMORPHONE HCL 1 MG/ML IJ SOLN: 0.2500 mg | INTRAMUSCULAR | Status: DC | PRN

## 2014-04-12 MED ORDER — POTASSIUM CHLORIDE ER 10 MEQ PO TBCR
10.0000 meq | EXTENDED_RELEASE_TABLET | Freq: Two times a day (BID) | ORAL | Status: DC
  Administered 2014-04-13: 10 meq via ORAL
  Filled 2014-04-12 (×2): qty 1

## 2014-04-12 MED ORDER — ROPIVACAINE HCL 5 MG/ML IJ SOLN
INTRAMUSCULAR | Status: DC | PRN
  Administered 2014-04-12: 30 mL via PERINEURAL

## 2014-04-12 MED ORDER — FENTANYL CITRATE 0.05 MG/ML IJ SOLN
INTRAMUSCULAR | Status: AC
  Filled 2014-04-12: qty 2

## 2014-04-12 MED ORDER — METHOCARBAMOL 1000 MG/10ML IJ SOLN
500.0000 mg | Freq: Four times a day (QID) | INTRAVENOUS | Status: DC | PRN
  Filled 2014-04-12: qty 5

## 2014-04-12 MED ORDER — CEFAZOLIN SODIUM-DEXTROSE 2-3 GM-% IV SOLR
2.0000 g | INTRAVENOUS | Status: AC
  Administered 2014-04-12: 2 g via INTRAVENOUS

## 2014-04-12 MED ORDER — DEXAMETHASONE SODIUM PHOSPHATE 10 MG/ML IJ SOLN: 10.0000 mg | Freq: Once | INTRAMUSCULAR | Status: DC

## 2014-04-12 MED ORDER — KETOROLAC TROMETHAMINE 30 MG/ML IJ SOLN
INTRAMUSCULAR | Status: DC | PRN
  Administered 2014-04-12: 30 mg

## 2014-04-12 MED ORDER — ONDANSETRON HCL 4 MG PO TABS: 4.0000 mg | ORAL_TABLET | Freq: Four times a day (QID) | ORAL | Status: DC | PRN

## 2014-04-12 MED ORDER — ONDANSETRON HCL 4 MG/2ML IJ SOLN
INTRAMUSCULAR | Status: DC | PRN
  Administered 2014-04-12: 4 mg via INTRAVENOUS

## 2014-04-12 MED ORDER — HYDROMORPHONE HCL 2 MG/ML IJ SOLN
INTRAMUSCULAR | Status: AC
  Filled 2014-04-12: qty 1

## 2014-04-12 MED ORDER — ENOXAPARIN SODIUM 40 MG/0.4ML ~~LOC~~ SOLN
40.0000 mg | SUBCUTANEOUS | Status: DC
  Administered 2014-04-13: 40 mg via SUBCUTANEOUS
  Filled 2014-04-12 (×2): qty 0.4

## 2014-04-12 MED ORDER — ROCURONIUM BROMIDE 100 MG/10ML IV SOLN
INTRAVENOUS | Status: AC
  Filled 2014-04-12: qty 1

## 2014-04-12 MED ORDER — PROPOFOL 10 MG/ML IV BOLUS
INTRAVENOUS | Status: AC
  Filled 2014-04-12: qty 20

## 2014-04-12 MED ORDER — METOCLOPRAMIDE HCL 10 MG PO TABS
5.0000 mg | ORAL_TABLET | Freq: Three times a day (TID) | ORAL | Status: DC | PRN
Start: 2014-04-12 — End: 2014-04-14

## 2014-04-12 MED ORDER — CEFAZOLIN SODIUM-DEXTROSE 2-3 GM-% IV SOLR
INTRAVENOUS | Status: AC
  Filled 2014-04-12: qty 50

## 2014-04-12 MED ORDER — KETOROLAC TROMETHAMINE 30 MG/ML IJ SOLN
INTRAMUSCULAR | Status: AC
  Filled 2014-04-12: qty 1

## 2014-04-12 MED ORDER — PROPOFOL 10 MG/ML IV BOLUS
INTRAVENOUS | Status: DC | PRN
  Administered 2014-04-12: 150 mg via INTRAVENOUS

## 2014-04-12 MED ORDER — METOCLOPRAMIDE HCL 5 MG/ML IJ SOLN
5.0000 mg | Freq: Three times a day (TID) | INTRAMUSCULAR | Status: DC | PRN
Start: 2014-04-12 — End: 2014-04-14

## 2014-04-12 MED ORDER — HYDROCHLOROTHIAZIDE 25 MG PO TABS
25.0000 mg | ORAL_TABLET | Freq: Every day | ORAL | Status: DC
  Administered 2014-04-12: 25 mg via ORAL
  Filled 2014-04-12 (×2): qty 1

## 2014-04-12 MED ORDER — DIPHENHYDRAMINE HCL 25 MG PO CAPS: 25.0000 mg | ORAL_CAPSULE | Freq: Four times a day (QID) | ORAL | Status: DC | PRN

## 2014-04-12 MED ORDER — DEXAMETHASONE SODIUM PHOSPHATE 10 MG/ML IJ SOLN
INTRAMUSCULAR | Status: DC | PRN
  Administered 2014-04-12: 10 mg via INTRAVENOUS

## 2014-04-12 MED ORDER — LIDOCAINE HCL (PF) 2 % IJ SOLN
INTRAMUSCULAR | Status: DC | PRN
  Administered 2014-04-12: 20 mg via INTRADERMAL

## 2014-04-12 MED ORDER — INFLUENZA VAC SPLIT QUAD 0.5 ML IM SUSY
0.5000 mL | PREFILLED_SYRINGE | INTRAMUSCULAR | Status: DC
  Filled 2014-04-12 (×2): qty 0.5

## 2014-04-12 MED ORDER — NEOSTIGMINE METHYLSULFATE 10 MG/10ML IV SOLN
INTRAVENOUS | Status: DC | PRN
  Administered 2014-04-12: 3 mg via INTRAVENOUS

## 2014-04-12 MED ORDER — PHENOL 1.4 % MT LIQD: 1.0000 | OROMUCOSAL | Status: DC | PRN

## 2014-04-12 MED ORDER — SODIUM CHLORIDE 0.9 % IJ SOLN
INTRAMUSCULAR | Status: AC
  Filled 2014-04-12: qty 10

## 2014-04-12 MED ORDER — MIDAZOLAM HCL 2 MG/2ML IJ SOLN
INTRAMUSCULAR | Status: AC
  Filled 2014-04-12: qty 2

## 2014-04-12 MED ORDER — HYDROMORPHONE HCL 1 MG/ML IJ SOLN
INTRAMUSCULAR | Status: DC | PRN
  Administered 2014-04-12 (×2): 1 mg via INTRAVENOUS

## 2014-04-12 MED ORDER — ATORVASTATIN CALCIUM 20 MG PO TABS
20.0000 mg | ORAL_TABLET | Freq: Every day | ORAL | Status: DC
  Administered 2014-04-12: 20 mg via ORAL
  Filled 2014-04-12 (×2): qty 1

## 2014-04-12 MED ORDER — FERROUS SULFATE 325 (65 FE) MG PO TABS
325.0000 mg | ORAL_TABLET | Freq: Three times a day (TID) | ORAL | Status: DC
  Administered 2014-04-12 – 2014-04-13 (×4): 325 mg via ORAL
  Filled 2014-04-12 (×6): qty 1

## 2014-04-12 MED ORDER — TRANEXAMIC ACID 100 MG/ML IV SOLN
2000.0000 mg | Freq: Once | INTRAVENOUS | Status: DC
  Filled 2014-04-12: qty 20

## 2014-04-12 MED ORDER — LIDOCAINE HCL (CARDIAC) 20 MG/ML IV SOLN
INTRAVENOUS | Status: AC
  Filled 2014-04-12: qty 5

## 2014-04-12 MED ORDER — BISACODYL 10 MG RE SUPP: 10.0000 mg | Freq: Every day | RECTAL | Status: DC | PRN

## 2014-04-12 MED ORDER — CELECOXIB 200 MG PO CAPS
200.0000 mg | ORAL_CAPSULE | Freq: Two times a day (BID) | ORAL | Status: DC
  Administered 2014-04-12 – 2014-04-13 (×2): 200 mg via ORAL
  Filled 2014-04-12 (×3): qty 1

## 2014-04-12 MED ORDER — GLYCOPYRROLATE 0.2 MG/ML IJ SOLN
INTRAMUSCULAR | Status: AC
  Filled 2014-04-12: qty 2

## 2014-04-12 MED ORDER — ASPIRIN-DIPYRIDAMOLE ER 25-200 MG PO CP12
1.0000 | ORAL_CAPSULE | Freq: Two times a day (BID) | ORAL | Status: DC
  Administered 2014-04-13: 1 via ORAL
  Filled 2014-04-12 (×3): qty 1

## 2014-04-12 SURGICAL SUPPLY — 74 items
ADH SKN CLS APL DERMABOND .7 (GAUZE/BANDAGES/DRESSINGS) ×1
BAG SPEC THK2 15X12 ZIP CLS (MISCELLANEOUS)
BAG ZIPLOCK 12X15 (MISCELLANEOUS) IMPLANT
BANDAGE ELASTIC 6 VELCRO ST LF (GAUZE/BANDAGES/DRESSINGS) ×3 IMPLANT
BANDAGE ESMARK 6X9 LF (GAUZE/BANDAGES/DRESSINGS) ×1 IMPLANT
BASEPLATE TIBIAL TRIATH (Orthopedic Implant) ×2 IMPLANT
BLADE SAW SGTL 13.0X1.19X90.0M (BLADE) ×3 IMPLANT
BLADE SAW SGTL 81X20 HD (BLADE) ×3 IMPLANT
BNDG CMPR 9X6 STRL LF SNTH (GAUZE/BANDAGES/DRESSINGS) ×1
BNDG ESMARK 6X9 LF (GAUZE/BANDAGES/DRESSINGS) ×3
BONE CEMENT GENTAMICIN (Cement) ×6 IMPLANT
BRUSH FEMORAL CANAL (MISCELLANEOUS) IMPLANT
BSPLAT TIB 4 UNV CMNT TL STAB (Orthopedic Implant) ×1 IMPLANT
CEMENT BONE GENTAMICIN 40 (Cement) ×3 IMPLANT
CEMENT RESTRICTOR DEPUY SZ 4 (Cement) ×2 IMPLANT
CUFF TOURN SGL QUICK 34 (TOURNIQUET CUFF) ×3
CUFF TRNQT CYL 34X4X40X1 (TOURNIQUET CUFF) ×1 IMPLANT
DERMABOND ADVANCED (GAUZE/BANDAGES/DRESSINGS) ×2
DERMABOND ADVANCED .7 DNX12 (GAUZE/BANDAGES/DRESSINGS) ×1 IMPLANT
DRAPE EXTREMITY TIBURON (DRAPES) ×3 IMPLANT
DRAPE POUCH INSTRU U-SHP 10X18 (DRAPES) ×3 IMPLANT
DRAPE U-SHAPE 47X51 STRL (DRAPES) ×3 IMPLANT
DRSG ADAPTIC 3X8 NADH LF (GAUZE/BANDAGES/DRESSINGS) IMPLANT
DRSG AQUACEL AG ADV 3.5X10 (GAUZE/BANDAGES/DRESSINGS) ×3 IMPLANT
DRSG AQUACEL AG ADV 3.5X14 (GAUZE/BANDAGES/DRESSINGS) ×2 IMPLANT
DRSG PAD ABDOMINAL 8X10 ST (GAUZE/BANDAGES/DRESSINGS) IMPLANT
DRSG TEGADERM 4X4.75 (GAUZE/BANDAGES/DRESSINGS) IMPLANT
DURAPREP 26ML APPLICATOR (WOUND CARE) ×6 IMPLANT
ELECT REM PT RETURN 9FT ADLT (ELECTROSURGICAL) ×3
ELECTRODE REM PT RTRN 9FT ADLT (ELECTROSURGICAL) ×1 IMPLANT
FACESHIELD WRAPAROUND (MASK) ×15 IMPLANT
FACESHIELD WRAPAROUND OR TEAM (MASK) ×5 IMPLANT
GAUZE SPONGE 2X2 8PLY STRL LF (GAUZE/BANDAGES/DRESSINGS) IMPLANT
GAUZE SPONGE 4X4 12PLY STRL (GAUZE/BANDAGES/DRESSINGS) IMPLANT
GLOVE BIOGEL PI IND STRL 7.5 (GLOVE) ×1 IMPLANT
GLOVE BIOGEL PI IND STRL 8.5 (GLOVE) ×1 IMPLANT
GLOVE BIOGEL PI INDICATOR 7.5 (GLOVE) ×2
GLOVE BIOGEL PI INDICATOR 8.5 (GLOVE) ×2
GLOVE ECLIPSE 8.0 STRL XLNG CF (GLOVE) ×3 IMPLANT
GLOVE ORTHO TXT STRL SZ7.5 (GLOVE) ×6 IMPLANT
GOWN SPEC L3 XXLG W/TWL (GOWN DISPOSABLE) ×3 IMPLANT
GOWN STRL REUS W/TWL LRG LVL3 (GOWN DISPOSABLE) ×3 IMPLANT
HANDPIECE INTERPULSE COAX TIP (DISPOSABLE) ×3
IMMOBILIZER KNEE 20 (SOFTGOODS)
IMMOBILIZER KNEE 20 THIGH 36 (SOFTGOODS) IMPLANT
INSERT TIB 4 16XPOST STAB BRNG (Insert) IMPLANT
KIT BASIN OR (CUSTOM PROCEDURE TRAY) ×3 IMPLANT
KNEE INSERT TIB 4X16 (Insert) ×3 IMPLANT
MANIFOLD NEPTUNE II (INSTRUMENTS) ×3 IMPLANT
NDL SAFETY ECLIPSE 18X1.5 (NEEDLE) ×1 IMPLANT
NEEDLE HYPO 18GX1.5 SHARP (NEEDLE) ×3
NS IRRIG 1000ML POUR BTL (IV SOLUTION) ×3 IMPLANT
PACK TOTAL JOINT (CUSTOM PROCEDURE TRAY) ×3 IMPLANT
PADDING CAST COTTON 6X4 STRL (CAST SUPPLIES) IMPLANT
POSITIONER SURGICAL ARM (MISCELLANEOUS) ×3 IMPLANT
SET HNDPC FAN SPRY TIP SCT (DISPOSABLE) ×1 IMPLANT
SET PAD KNEE POSITIONER (MISCELLANEOUS) ×3 IMPLANT
SPONGE GAUZE 2X2 STER 10/PKG (GAUZE/BANDAGES/DRESSINGS)
SPONGE LAP 18X18 X RAY DECT (DISPOSABLE) IMPLANT
STAPLER VISISTAT 35W (STAPLE) IMPLANT
STEM CEMENTED TRIATHLON (Stem) ×2 IMPLANT
SUCTION FRAZIER 12FR DISP (SUCTIONS) ×3 IMPLANT
SUT MNCRL AB 3-0 PS2 18 (SUTURE) ×3 IMPLANT
SUT VIC AB 1 CT1 36 (SUTURE) ×5 IMPLANT
SUT VIC AB 2-0 CT1 27 (SUTURE) ×9
SUT VIC AB 2-0 CT1 TAPERPNT 27 (SUTURE) ×3 IMPLANT
SUT VLOC 180 0 24IN GS25 (SUTURE) ×3 IMPLANT
SYR 50ML LL SCALE MARK (SYRINGE) ×3 IMPLANT
TIBIAL SYMMETRIC CONE AUG KNEE (Orthopedic Implant) ×2 IMPLANT
TOWEL OR 17X26 10 PK STRL BLUE (TOWEL DISPOSABLE) ×3 IMPLANT
TOWER CARTRIDGE SMART MIX (DISPOSABLE) ×3 IMPLANT
TRAY FOLEY CATH 14FRSI W/METER (CATHETERS) ×3 IMPLANT
WATER STERILE IRR 1500ML POUR (IV SOLUTION) ×3 IMPLANT
WRAP KNEE MAXI GEL POST OP (GAUZE/BANDAGES/DRESSINGS) ×3 IMPLANT

## 2014-04-12 NOTE — Anesthesia Postprocedure Evaluation (Signed)
  Anesthesia Post-op Note  Patient: Kari Flores  Procedure(s) Performed: Procedure(s) (LRB): REVISION RIGHT TOTAL KNEE REPLACEMENT/TIABIAL TRAY VS TOTAL (Right)  Patient Location: PACU  Anesthesia Type: Spinal  Level of Consciousness: awake and alert   Airway and Oxygen Therapy: Patient Spontanous Breathing  Post-op Pain: mild  Post-op Assessment: Post-op Vital signs reviewed, Patient's Cardiovascular Status Stable, Respiratory Function Stable, Patent Airway and No signs of Nausea or vomiting  Last Vitals:  Filed Vitals:   04/12/14 1100  BP: 125/52  Pulse: 56  Temp:   Resp: 12    Post-op Vital Signs: stable   Complications: No apparent anesthesia complications

## 2014-04-12 NOTE — Transfer of Care (Signed)
Immediate Anesthesia Transfer of Care Note  Patient: Kari Flores  Procedure(s) Performed: Procedure(s) (LRB): REVISION RIGHT TOTAL KNEE REPLACEMENT/TIABIAL TRAY VS TOTAL (Right)  Patient Location: PACU  Anesthesia Type: General  Level of Consciousness: sedated, patient cooperative and responds to stimulation  Airway & Oxygen Therapy: Patient Spontanous Breathing and Patient connected to face mask oxgen  Post-op Assessment: Report given to PACU RN and Post -op Vital signs reviewed and stable  Post vital signs: Reviewed and stable  Complications: No apparent anesthesia complications

## 2014-04-12 NOTE — Anesthesia Procedure Notes (Signed)
Anesthesia Regional Block:  Femoral nerve block  Pre-Anesthetic Checklist: ,, timeout performed, Correct Patient, Correct Site, Correct Laterality, Correct Procedure, Correct Position, site marked, Risks and benefits discussed,  Surgical consent,  Pre-op evaluation,  At surgeon's request and post-op pain management  Laterality: Right  Prep: chloraprep       Needles:  Injection technique: Single-shot  Needle Type: Echogenic Stimulator Needle     Needle Length: 9cm 9 cm Needle Gauge: 20 and 20 G    Additional Needles:  Procedures: ultrasound guided (picture in chart) Femoral nerve block  Nerve Stimulator or Paresthesia:  Response: 0.5 mA,   Additional Responses:   Narrative:  Start time: 04/12/2014 7:10 AM End time: 04/12/2014 7:20 AM Injection made incrementally with aspirations every 5 mL.  Performed by: Personally  Anesthesiologist: Gaetano Hawthorneharles L Ayham Word MD  Additional Notes: Patient tolerated the procedure well without complications

## 2014-04-12 NOTE — Brief Op Note (Signed)
04/12/2014  9:50 AM  PATIENT:  Kari Flores  73 y.o. female  PRE-OPERATIVE DIAGNOSIS:  1. Failed right total knee replacement, tibial failure  POST-OPERATIVE DIAGNOSIS: 1. Failed right total knee replacement, tibial failure  PROCEDURE:  Procedure(s): REVISION RIGHT TOTAL KNEE REPLACEMENT/TIABIAL TRAY VS TOTAL (Right) [employer]  SURGEON:  Surgeon(s) and Role:    * Shelda PalMatthew D Oliveah Zwack, MD - Primary  PHYSICIAN ASSISTANT: Lanney GinsMatthew Babish, PA-C  ANESTHESIA:   regional and general  EBL:  Total I/O In: 1000 [I.V.:1000] Out: 150 [Urine:150]  BLOOD ADMINISTERED:none  DRAINS: none   LOCAL MEDICATIONS USED:  Exparel, Marcaine combo  SPECIMEN:  No Specimen  DISPOSITION OF SPECIMEN:  N/A  COUNTS:  YES  TOURNIQUET:   Total Tourniquet Time Documented: Thigh (Right) - 56 minutes Total: Thigh (Right) - 56 minutes   DICTATION: .Other Dictation: Dictation Number [id number]  PLAN OF CARE: Admit to inpatient   PATIENT DISPOSITION:  PACU - hemodynamically stable.   Delay start of Pharmacological VTE agent (>24hrs) due to surgical blood loss or risk of bleeding: no

## 2014-04-12 NOTE — Progress Notes (Addendum)
[facility] the [facility]: rw (patient declined commode)  If patient discharges after hours, please call [phone].   Renard HamperLecretia Williamson 04/12/2014, 12:02 PM

## 2014-04-12 NOTE — Interval H&P Note (Signed)
History and Physical Interval Note:  04/12/2014 7:20 AM  Kari Flores  has presented today for surgery, with the diagnosis of failed right total knee replacement tibial failure  The various methods of treatment have been discussed with the patient and family. After consideration of risks, benefits and other options for treatment, the patient has consented to  Procedure(s): REVISION RIGHT TOTAL KNEE REPLACEMENT/TIABIAL TRAY VS TOTAL (Right) as a surgical intervention .  The patient's history has been reviewed, patient examined, no change in status, stable for surgery.  I have reviewed the patient's chart and labs.  Questions were answered to the patient's satisfaction.     Shelda PalLIN,Guage Efferson D

## 2014-04-12 NOTE — Evaluation (Signed)
Physical Therapy Evaluation Patient Details Name: Kari Flores MRN: [id number] DOB: [birth year] Today's Date: 04/12/2014   History of Present Illness  s/p R  TKA revision  Clinical Impression  Pt will benefit from PT to address deficits below; Pt asking about if she will need HHPT; will assess further next session; no DME needs    Follow Up Recommendations No PT follow up;Home health PT (depending on I with HEP/ROM, mobility)    Equipment Recommendations  None recommended by PT    Recommendations for Other Services       Precautions / Restrictions Restrictions Other Position/Activity Restrictions: WBAT      Mobility  Bed Mobility Overal bed mobility: Needs Assistance Bed Mobility: Supine to Sit     Supine to sit: Supervision     General bed mobility comments: cues for technique  Transfers Overall transfer level: Needs assistance Equipment used: Rolling walker (2 wheeled) Transfers: Sit to/from Stand Sit to Stand: +2 safety/equipment         General transfer comment: cues for hand placement and RLE position  Ambulation/Gait Ambulation/Gait assistance: Min assist;+2 safety/equipment Ambulation Distance (Feet): 6 Feet Assistive device: Rolling walker (2 wheeled) Gait Pattern/deviations: Step-to pattern     General Gait Details: pt with knee buckling when WBing on RLE, +2 for safety, tactile cues and facilitation R quads when WBing; pt reports good sensation and demo's good quad contraction but difficulty with WBing; verbal cues for use of RW and safety  Stairs            Wheelchair Mobility    Modified Rankin (Stroke Patients Only)       Balance                                             Pertinent Vitals/Pain Pain Assessment: No/denies pain    Home Living Family/patient expects to be discharged to:: Private residence Living Arrangements: Spouse/significant other Available Help at Discharge: Family Type of Home:  House Home Access: Stairs to enter   Secretary/administrator of Steps: 1 Home Layout: Able to live on main level with bedroom/bathroom;Two level Home Equipment: Walker - standard;Cane - quad      Prior Function Level of Independence: Independent               Hand Dominance        Extremity/Trunk Assessment   Upper Extremity Assessment: Overall WFL for tasks assessed;Defer to OT evaluation           Lower Extremity Assessment: RLE deficits/detail RLE Deficits / Details: hip flexion 3/5, knee 3+/5, ankle WFL; (knee buckling with WBing )       Communication   Communication: No difficulties  Cognition Arousal/Alertness: Awake/alert Behavior During Therapy: WFL for tasks assessed/performed Overall Cognitive Status: Within Functional Limits for tasks assessed                      General Comments      Exercises Total Joint Exercises Ankle Circles/Pumps: AROM;Both;10 reps Quad Sets: 10 reps;Both;AROM Heel Slides: AROM;AAROM;10 reps;Right      Assessment/Plan    PT Assessment Patient needs continued PT services  PT Diagnosis Difficulty walking   PT Problem List Decreased strength;Decreased range of motion;Decreased mobility  PT Treatment Interventions DME instruction;Gait training;Stair training;Functional mobility training;Therapeutic activities;Therapeutic exercise;Patient/family education   PT Goals (Current goals can be found in  the Care Plan section) Acute Rehab PT Goals Patient Stated Goal: home, decr pain, knee better PT Goal Formulation: With patient Time For Goal Achievement: 04/15/14 Potential to Achieve Goals: Good    Frequency 7X/week   Barriers to discharge        Co-evaluation               End of Session Equipment Utilized During Treatment: Gait belt Activity Tolerance: Patient tolerated treatment well Patient left: in chair;with call bell/phone within reach Nurse Communication: Mobility status (NT informed R knee  buckling)         Time: [id number] PT Time Calculation (min): 18 min   Charges:   PT Evaluation $Initial PT Evaluation Tier I: 1 Procedure PT Treatments $Gait Training: 8-22 mins   PT G Codes:          Kari Flores 04/12/2014, 4:34 PM

## 2014-04-12 NOTE — Op Note (Signed)
NAMEENYAH, MOMAN              ACCOUNT NO.:  [id number]  MEDICAL RECORD NO.:  [id number]  LOCATION:  1610                         FACILITY:  [facility]  PHYSICIAN:  Madlyn Frankel. Charlann Boxer, M.D.  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  FINDINGS:  There was no concern for infection process inside the knee. In addition to revising the tibial tray which was relatively easy to remove, I was able to debride the posterior and posterior lateral aspect of the knee where she had symptoms.  PROCEDURE:  Revision of right total knee arthroplasty with a tibial component from [employer] with a size 4 tibial base plate, a size A Tritanium cone and a 12 x 50 mm cemented stem and a size 16 x 4 posterior stabilized insert.  SURGEON:  Madlyn Frankel. Charlann Boxer, M.D.  ASSISTANT:  Lanney Gins, PAC.  Note that Mr. Carmon Sails was present for the entirety of the case from preoperative positioning, perioperative management of operative extremity, general facilitation of the case.  ANESTHESIA:  Preoperative regional femoral nerve block plus general anesthetic.  SPECIMENS:  None.  COMPLICATIONS:  None.  DRAINS:  None.  TOURNIQUET TIME:  56 minutes at 250 mmHg.  INDICATIONS FOR PROCEDURE:  Ms. Ewing is a very pleasant 73 year old female, known to me from a prior revision surgery for debridement purposes polyethylene exchange in 2008.  She had done well until recently noted onset of pain that limited her overall quality of life and function.  Workup radiographs confirmed to have stable components. Bone scan raised concerns for loose tibial tray.  Given the persistence of her pain, the clinical correlation with bone scan findings led  to discussion of the surgical intervention.  I felt that an isolated tibial revision will be best in her situation to prevent further bone loss given her age is 27.  After discussing risks of infection, DVT, component failure, need for future revision surgery, she wished to proceed.  Consent was obtained for benefit of pain relief.  PROCEDURE IN DETAIL:  The patient was brought to the operative theater. Once adequate anesthesia, preoperative antibiotics, Ancef was administered, she was positioned supine.  Her right lower extremity was placed into a proximal thigh tourniquet.  The right lower extremity was then prepped and draped in a sterile fashion with the right leg in a leg holder.  Time-out was performed identifying the patient, planned procedure, and extremity.  The leg was exsanguinated and tourniquet was elevated to 250 mmHg.  Her old incision was excised.  Soft tissue planes created and median arthrotomy was made.  An initial exposure of the proximal aspect of the medial tibia was carried out to allow for tibial subluxation and also needed to debride along lateral tibial tray.  Following this debridement, the knee was able to be flexed.  I removed the old polyethylene insert and then made more of an effort at this point to remove more scarring.  Once then the  proximal aspect of the tibia was subluxated forward with protecting the femoral component, I used a thin ACL saw and undermined the bone cement interface on the tibia and then was able to use the extraction jig to slap the component out without difficulty or significant bone loss.  At this point, the tibial canal was opened using a hand drill and the intramedullary rod was then placed into the tibia.  Reamed up to a size 12 mm reamer and maintained this in position.  We used then a cutting guide and refreshed the cut off the proximal tibia removing some bone laterally, minimal bone medially.  This freshened up this cut  including removing the lateral bone and posterior lateral bone as well as one of the primary focus of the procedure.  The cut surface seemed to be best fit with a size 4 tibial tray.  With the intramedullary rod in place, I then reamed over top of this for a cone reaming down just to a size 8 cone.  With this in place, I then did a trial reduction with a 4 tray with a 12 x 50 mm stem.  With the trial reductions, I felt the 16 insert at this point gave us the best extension and flexion stability.  Her medial and lateral collateral ligaments remained intact during this time.  No further stability required.  Once this was carried out, we removed the trial components, placed a cement restrictor into the proximal tibia at the measured depth.  The final components were opened, a size 8 Tritanium cone was then impacted into the prepared cone bed, and then the final components configured on the back table under direct supervision were cemented into place with the knee brought to extension with a 16 mm insert.  The tourniquet was let down after 56 minutes.  No significant hemostasis required.  Once the cement had fully cured, excessive cement was removed throughout the knee, the final 16 x 4 insert was then impacted onto the tibial tray.  The knee had been irrigated throughout the case again at this point.  The extensor mechanism was then reapproximated using #1 Vicryl and 0 V-Loc suture with the knee in flexion.  The remaining wound was closed with 2-0 Vicryl and running 3-0 Monocryl.  The knee was cleaned, dried, and dressed sterilely using Dermabond and Aquacel dressing.  She was then brought to recovery room, extubated in stable condition, tolerated the procedure well.     Madlyn FrankelMatthew D. Charlann Boxerlin, M.D.     MDO/MEDQ  D:  04/12/2014  T:  04/12/2014  Job:  [id number]

## 2014-04-13 ENCOUNTER — Encounter (HOSPITAL_COMMUNITY): Payer: Self-pay | Admitting: Orthopedic Surgery

## 2014-04-13 LAB — BASIC METABOLIC PANEL
ANION GAP: 10 (ref 5–15)
BUN: 15 mg/dL (ref 6–23)
CHLORIDE: 103 meq/L (ref 96–112)
CO2: 26 mEq/L (ref 19–32)
Calcium: 8.4 mg/dL (ref 8.4–10.5)
Creatinine, Ser: 0.8 mg/dL (ref 0.50–1.10)
GFR calc non Af Amer: 72 mL/min — ABNORMAL LOW (ref >90)
GFR, EST AFRICAN AMERICAN: 84 mL/min — AB (ref >90)
Glucose, Bld: 133 mg/dL — ABNORMAL HIGH (ref 70–99)
Potassium: 4.8 mEq/L (ref 3.7–5.3)
SODIUM: 139 meq/L (ref 137–147)

## 2014-04-13 LAB — CBC
HEMATOCRIT: 31.3 % — AB (ref 36.0–46.0)
Hemoglobin: 10.6 g/dL — ABNORMAL LOW (ref 12.0–15.0)
MCH: 30.5 pg (ref 26.0–34.0)
MCHC: 33.9 g/dL (ref 30.0–36.0)
MCV: 89.9 fL (ref 78.0–100.0)
PLATELETS: 186 10*3/uL (ref 150–400)
RBC: 3.48 MIL/uL — ABNORMAL LOW (ref 3.87–5.11)
RDW: 13.3 % (ref 11.5–15.5)
WBC: 13 10*3/uL — ABNORMAL HIGH (ref 4.0–10.5)

## 2014-04-13 MED ORDER — POLYETHYLENE GLYCOL 3350 17 G PO PACK: 17.0000 g | PACK | Freq: Two times a day (BID) | ORAL | Status: DC

## 2014-04-13 MED ORDER — DSS 100 MG PO CAPS: 100.0000 mg | ORAL_CAPSULE | Freq: Two times a day (BID) | ORAL | Status: DC

## 2014-04-13 MED ORDER — ENOXAPARIN SODIUM 40 MG/0.4ML ~~LOC~~ SOLN: 40.0000 mg | SUBCUTANEOUS | Status: DC

## 2014-04-13 MED ORDER — ENOXAPARIN (LOVENOX) PATIENT EDUCATION KIT
PACK | Freq: Once | Status: AC
Start: 2014-04-13 — End: 2014-04-13
  Administered 2014-04-13: 17:00:00
  Filled 2014-04-13: qty 1

## 2014-04-13 MED ORDER — FERROUS SULFATE 325 (65 FE) MG PO TABS: 325.0000 mg | ORAL_TABLET | Freq: Three times a day (TID) | ORAL | Status: DC

## 2014-04-13 MED ORDER — HYDROCODONE-ACETAMINOPHEN 7.5-325 MG PO TABS: 1.0000 | ORAL_TABLET | ORAL | Status: DC | PRN

## 2014-04-13 NOTE — Progress Notes (Signed)
UR Complete.  Kari Flores, BSN, CM [phone]. 

## 2014-04-13 NOTE — Evaluation (Signed)
Occupational Therapy Evaluation Patient Details Name: Kari HarbourMary Malburg MRN: [id number] DOB: 1941-01-15 Today's Date: 04/13/2014    History of Present Illness s/p R  TKA revision   Clinical Impression   This 73 year old female was admitted for R TKA revision; her original surgery was approximately 8 years ago.  Prior to admission, pt was independent with adls.  She currently needs min A.  Pt will be followed in acute for bathroom transfers.  Pt's R knee buckled during shower transfer attempt.  She does not feel she needs DME.  Goals for bathroom transfers are set at supervision to min guard levels.    Follow Up Recommendations  No OT follow up    Equipment Recommendations  None recommended by OT (pt feels she will be able to manage with her standard commod)    Recommendations for Other Services       Precautions / Restrictions Precautions Precautions: Fall Restrictions Weight Bearing Restrictions: No      Mobility Bed Mobility                  Transfers   Equipment used: Rolling walker (2 wheeled) Transfers: Sit to/from Stand Sit to Stand: Min guard         General transfer comment: no cues needed.  Min guard for safety.  Still has some residual numbness which is improving    Balance                                            ADL Overall ADL's : Needs assistance/impaired                         Toilet Transfer: Min guard;Ambulation;RW       Tub/ Engineer, structuralhower Transfer:  (attempted; unable)     General ADL Comments: Pt is able to perform UB adls with set up and LB adls with min A.  Husband can assist her at home as needed.  Pt has been able to get up from standard commode, using doorknob from closed door.  Educated on using walker to stand as alternative, to give her more support.  Ambulated to bathroom and attempted shower transfer.  When attempting to step in backwards with LLE, Pt's RLE buckled and she needed support to regain  balance.  Pt not ready for this currently.  Reviewed sequence and told pt that we can either reattempt prior to her discharge or she can have HHPT do this with her.  also educated that she can stand at sink to wash hair, if she doesn't feel up to shower transfer     Vision                     Perception     Praxis      Pertinent Vitals/Pain Pain Assessment: No/denies pain     Hand Dominance     Extremity/Trunk Assessment Upper Extremity Assessment Upper Extremity Assessment: Overall WFL for tasks assessed           Communication Communication Communication: No difficulties   Cognition Arousal/Alertness: Awake/alert Behavior During Therapy: WFL for tasks assessed/performed Overall Cognitive Status: Within Functional Limits for tasks assessed                     General Comments       Exercises  Shoulder Instructions      Home Living Family/patient expects to be discharged to:: Private residence Living Arrangements: Spouse/significant other   Type of Home: House Home Access: Stairs to enter [employer] of Steps: 1   Home Layout: Able to live on main level with bedroom/bathroom;Two level     Bathroom Shower/Tub: Producer, television/film/video: Standard         Additional Comments: pt has been able to get up from standard commode in past.  Doesn't feel she'll need 3:1      Prior Functioning/Environment Level of Independence: Independent             OT Diagnosis: Generalized weakness   OT Problem List: Decreased strength   OT Treatment/Interventions: Self-care/ADL training;DME and/or AE instruction;Patient/family education    OT Goals(Current goals can be found in the care plan section) Acute Rehab OT Goals Patient Stated Goal: home, decr pain, knee better OT Goal Formulation: With patient Time For Goal Achievement: 04/20/14 Potential to Achieve Goals: Good ADL Goals Pt Will Transfer to Toilet: with  supervision;regular height toilet;ambulating Pt Will Perform Tub/Shower Transfer: Shower transfer;ambulating;with min guard assist  OT Frequency: Min 2X/week   Barriers to D/C:            Co-evaluation              End of Session    Activity Tolerance: Patient tolerated treatment well Patient left: in chair;with call bell/phone within reach   Time: 0942-0956 OT Time Calculation (min): 14 min Charges:  OT General Charges $OT Visit: 1 Procedure OT Evaluation $Initial OT Evaluation Tier I: 1 Procedure OT Treatments $Self Care/Home Management : 8-22 mins G-Codes:    Sharell Hilmer 05/09/14, 10:54 AM Marica Otter, OTR/L [phone] 2014-05-09

## 2014-04-13 NOTE — Care Management Note (Addendum)
  Page 2 of 2   04/13/2014     10:45:06 AM CARE MANAGEMENT NOTE 04/13/2014  Patient:  Kari Flores, Kari Flores   Account Number:  [id number]  Date Initiated:  04/13/2014  Documentation initiated by:  [facility]  Subjective/Objective Assessment:   adm: REVISION RIGHT TOTAL KNEE REPLACEMENT/TIABIAL TRAY VS TOTAL (Right)     Action/Plan:   discharge planning   Anticipated DC Date:  04/13/2014   Anticipated DC Plan:  [name]  CM consult      [facility] Choice  HOME HEALTH   Choice offered to / List presented to:  C-1 Patient   DME arranged  NA      DME agency  NA     [place] arranged  HH-2 PT      [place]   Status of service:  Completed, signed off Medicare Important Message given?   (If response is "NO", the following Medicare IM given date fields will be blank) Date Medicare IM given:   Medicare IM given by:   Date Additional Medicare IM given:   Additional Medicare IM given by:    Discharge Disposition:  [name]  Per UR Regulation:    If discussed at Long Length of Stay Meetings, dates discussed:    Comments:  04/13/14 15:50 Pt has changed her mind and has requested a rolling walker.  CM called DME rep, Pura Spice who states she will deliver RW prior to discharge.  No other CM needs were communicated.  Mariane Masters, BSN, CM [phone].  09:00 CM met with pt in room to offer choice of home health agency.  Pt chooses [facility] to render HHPT.  No DME needed as pt states she has all she need at home. Address and contact information verified with pt.  Referral called to Shaune Leeks.  No other CM needs were communicated.  Mariane Masters, BSN, CM [phone].

## 2014-04-13 NOTE — Progress Notes (Signed)
Physical Therapy Treatment Patient Details Name: Rip HarbourMary Denker MRN: [id number] DOB: 1940/10/26 Today's Date: 04/13/2014    History of Present Illness s/p R  TKA revision    PT Comments    POD # 1 pm session.  Amb in hallway and performed stairs up backward due to no rails with 50% VC's on proper tech and safety.  Handout also given.  Pt reports her knee feels stronger and did not buckle as prior during OT session stepping into shower transfer.  Pt stated, MD told her she could possible go home later today if she felt comfortable performing stairs.  Pt asked about going home today and feels more confident in her knee.  Reported to RN and that also pt needs a RW.  Follow Up Recommendations  Home health PT     Equipment Recommendations  Rolling walker with 5" wheels (pt reports her walker does NOT have wheels and needs one)    Recommendations for Other Services       Precautions / Restrictions Precautions Precautions: Fall Restrictions Weight Bearing Restrictions: No Other Position/Activity Restrictions: WBAT    Mobility  Bed Mobility               General bed mobility comments: Pt OOB in recliner  Transfers Overall transfer level: Needs assistance Equipment used: Rolling walker (2 wheeled) Transfers: Sit to/from Stand Sit to Stand: Supervision;Min guard         General transfer comment: good safety tech and reports knee feeling stronger  Ambulation/Gait Ambulation/Gait assistance: Supervision;Min guard Ambulation Distance (Feet): 95 Feet Assistive device: Rolling walker (2 wheeled) Gait Pattern/deviations: Step-to pattern     General Gait Details: 25% VC's on proper use RW as pt attempts to pick up each step.  Pt reports her knee feels better and stronger than this morning.   Stairs            Wheelchair Mobility    Modified Rankin (Stroke Patients Only)       Balance                                    Cognition                             Exercises      General Comments        Pertinent Vitals/Pain      Home Living                      Prior Function            PT Goals (current goals can now be found in the care plan section) Progress towards PT goals: Progressing toward goals    Frequency  7X/week    PT Plan      Co-evaluation             End of Session Equipment Utilized During Treatment: Gait belt Activity Tolerance: Patient tolerated treatment well Patient left: in chair;with call bell/phone within reach     Time: 1540-1600 PT Time Calculation (min): 20 min  Charges:  $Gait Training: 8-22 mins $Therapeutic Exercise: 8-22 mins                    G Codes:      Felecia ShellingLori Nysa Sarin  PTA [facility]  Acute  Rehab Pager  [phone]  

## 2014-04-13 NOTE — Progress Notes (Signed)
Physical Therapy Treatment Patient Details Name: Kari Flores MRN: [id number] DOB: 1940/07/25 Today's Date: 04/13/2014    History of Present Illness s/p R  TKA revision    PT Comments    POD # 1 am session.  Pt fully dressed and sitting in recliner.  Amb in hallway then performed TKR TE's followed by ICE.    Follow Up Recommendations  Home health PT     Equipment Recommendations  None recommended by PT    Recommendations for Other Services       Precautions / Restrictions Precautions Precautions: Fall Restrictions Weight Bearing Restrictions: No Other Position/Activity Restrictions: WBAT    Mobility  Bed Mobility               General bed mobility comments: Pt OOB in recliner  Transfers Overall transfer level: Needs assistance Equipment used: Rolling walker (2 wheeled) Transfers: Sit to/from Stand Sit to Stand: Supervision;Min guard         General transfer comment: good safety tech  Ambulation/Gait Ambulation/Gait assistance: Supervision;Min guard Ambulation Distance (Feet): 85 Feet Assistive device: Rolling walker (2 wheeled)       General Gait Details: 50% VC's to decrease gait speed and step length.  Pt over stepping and causing increased knee buckling.     Stairs            Wheelchair Mobility    Modified Rankin (Stroke Patients Only)       Balance                                    Cognition Arousal/Alertness: Awake/alert Behavior During Therapy: WFL for tasks assessed/performed Overall Cognitive Status: Within Functional Limits for tasks assessed                      Exercises   Total Knee Replacement TE's 10 reps B LE ankle pumps 10 reps towel squeezes 10 reps knee presses 10 reps heel slides  10 reps SAQ's 10 reps SLR's 10 reps ABD Followed by ICE     General Comments        Pertinent Vitals/Pain Pain Assessment: No/denies pain    Home Living Family/patient expects to be  discharged to:: Private residence Living Arrangements: Spouse/significant other   Type of Home: House Home Access: Stairs to enter   Home Layout: Able to live on main level with bedroom/bathroom;Two level   Additional Comments: pt has been able to get up from standard commode in past.  Doesn't feel she'll need 3:1    Prior Function Level of Independence: Independent          PT Goals (current goals can now be found in the care plan section) Acute Rehab PT Goals Patient Stated Goal: home, decr pain, knee better Progress towards PT goals: Progressing toward goals    Frequency  7X/week    PT Plan      Co-evaluation             End of Session Equipment Utilized During Treatment: Gait belt Activity Tolerance: Patient tolerated treatment well Patient left: in chair;with call bell/phone within reach     Time: [id number] PT Time Calculation (min): 36 min  Charges:  $Gait Training: 8-22 mins $Therapeutic Exercise: 8-22 mins                    G Codes:      Felecia ShellingLori Devinn Voshell  PTA [facility]  Acute  Rehab Pager      [phone]

## 2014-04-13 NOTE — Progress Notes (Signed)
Patient ID: Kari Flores, female   DOB: 28-Jul-1940, 73 y.o.   MRN: [id number] Subjective: 1 Day Post-Op Procedure(s) (LRB): REVISION RIGHT TOTAL KNEE REPLACEMENT/TIABIAL TRAY VS TOTAL (Right)    Patient reports pain as mild.  May still have some residual FNB anesthetic effect despite motor fxn returning.  No events last night  Objective:   VITALS:   Filed Vitals:   04/13/14 0900  BP: 138/57  Pulse: 58  Temp: 98.3 F (36.8 C)  Resp: 16    Neurovascular intact Incision: dressing C/D/I  LABS  Recent Labs  04/12/14 1227 04/13/14 0500  HGB 12.5 10.6*  HCT 37.3 31.3*  WBC 12.0* 13.0*  PLT 198 186     Recent Labs  04/12/14 1227 04/13/14 0500  NA  --  139  K  --  4.8  BUN  --  15  CREATININE 0.75 0.80  GLUCOSE  --  133*    No results found for this basename: LABPT, INR,  in the last 72 hours   Assessment/Plan: 1 Day Post-Op Procedure(s) (LRB): REVISION RIGHT TOTAL KNEE REPLACEMENT/TIABIAL TRAY VS TOTAL (Right)   Advance diet Up with therapy Discharge home with home health after therapy today if she progresses well  RTC in 2 weeks

## 2014-04-14 ENCOUNTER — Encounter (HOSPITAL_COMMUNITY): Payer: Self-pay | Admitting: Orthopedic Surgery

## 2014-04-15 ENCOUNTER — Encounter (HOSPITAL_COMMUNITY): Payer: Self-pay | Admitting: Orthopedic Surgery

## 2014-04-15 NOTE — Discharge Summary (Signed)
Physician Discharge Summary  Patient ID: Kari Flores MRN: [id number] DOB/AGE: 1941-04-07 73 y.o.  Admit date: 04/12/2014 Discharge date: 04/13/2014   Procedures:  Procedure(s) (LRB): REVISION RIGHT TOTAL KNEE REPLACEMENT/TIABIAL TRAY VS TOTAL (Right)  Attending Physician:  Dr. Durene Romans   Admission Diagnoses:   Right knee pain s/p total knee arthroplasty  Discharge Diagnoses:  Active Problems:   S/P revision of total knee  Past Medical History  Diagnosis Date  . Hypertension   . Arthritis   . GERD (gastroesophageal reflux disease)   . H/O blood clots     after knee surgery -1 st surgery right knee-in lower leg  . Stroke     no deficits-on Aggrenox- sometime before first knee surgery    HPI: Kari Flores, 73 y.o. female, has a history of pain and functional disability in the right knee(s) due to failed previous arthroplasty (tibial component) and patient has failed non-surgical conservative treatments for greater than 12 weeks to include NSAID's and/or analgesics, flexibility and strengthening excercises, supervised PT with diminished ADL's post treatment, use of assistive devices and activity modification. The indications for the revision of the total knee arthroplasty are loosening of one or more components. Onset of symptoms was gradual starting months ago with rapidlly worsening course since that time. Prior procedures on the right knee(s) include arthroplasty and revision knee surgery (11/04/2006) per Dr. Charlann Boxer. Patient currently rates pain in the right knee(s) at 9-10 out of 10 with activity. There is night pain, worsening of pain with activity and weight bearing, pain that interferes with activities of daily living, pain with passive range of motion and joint swelling. Patient has evidence of prosthetic loosening by imaging studies. This condition presents safety issues increasing the risk of falls. There is no current active infection. Risks, benefits and expectations  were discussed with the patient. Risks including but not limited to the risk of anesthesia, blood clots, nerve damage, blood vessel damage, failure of the prosthesis, infection and up to and including death. Patient understand the risks, benefits and expectations and wishes to proceed with surgery.   PCP: Court Joy, PA-C   Discharged Condition: good  Hospital Course:  Patient underwent the above stated procedure on 04/12/2014. Patient tolerated the procedure well and brought to the recovery room in good condition and subsequently to the floor.  POD #1 BP: 138/57 ; Pulse: 58 ; Temp: 98.3 F (36.8 C) ; Resp: 16  Patient reports pain as mild. May still have some residual FNB anesthetic effect despite motor fxn returning. No events last night. Ready to be discharged home. Dorsiflexion/plantar flexion intact, incision: dressing C/D/I, no cellulitis present and compartment soft.   LABS  Basename    HGB  1.6  HCT  31.3    Discharge Exam: General appearance: alert, cooperative and no distress Extremities: Homans sign is negative, no sign of DVT, no edema, redness or tenderness in the calves or thighs and no ulcers, gangrene or trophic changes  Disposition: Home with follow up in 2 weeks   Follow-up Information   Follow up with [facility]. (home health physical therapy)    Contact information:   [address] [place] [address] [phone]       Follow up with Shelda Pal, MD. Schedule an appointment as soon as possible for a visit in 2 weeks.   Specialty:  Orthopedic Surgery   Contact information:   [address] [place] [address] [phone]  Discharge Instructions   Call MD / Call 911    Complete by:  As directed   If you experience chest pain or shortness of breath, CALL 911 and be transported to the hospital emergency room.  If you develope a fever above 101 F, pus (white drainage) or increased drainage or  redness at the wound, or calf pain, call your surgeon's office.     Change dressing    Complete by:  As directed   Maintain surgical dressing for 10-14 days, or until follow up in the clinic.     Constipation Prevention    Complete by:  As directed   Drink plenty of fluids.  Prune juice may be helpful.  You may use a stool softener, such as Colace (over the counter) 100 mg twice a day.  Use MiraLax (over the counter) for constipation as needed.     Diet - low sodium heart healthy    Complete by:  As directed      Discharge instructions    Complete by:  As directed   Maintain surgical dressing for 10-14 days, or until follow up in the clinic. [facility] Call with any questions or concerns.     Driving restrictions    Complete by:  As directed   No driving for 4 weeks     Increase activity slowly as tolerated    Complete by:  As directed      TED hose    Complete by:  As directed   Use stockings (TED hose) for 2 weeks on both leg(s).  You may remove them at night for sleeping.     Weight bearing as tolerated    Complete by:  As directed   Laterality:  right  Extremity:  Lower             Medication List         amLODipine 2.5 MG tablet  Commonly known as:  NORVASC  Take 2.5 mg by mouth at bedtime.     atorvastatin 20 MG tablet  Commonly known as:  LIPITOR  Take 20 mg by mouth at bedtime.     dipyridamole-aspirin 200-25 MG per 12 hr capsule  Commonly known as:  AGGRENOX  Take 1 capsule by mouth 2 (two) times daily.     DSS 100 MG Caps  Take 100 mg by mouth 2 (two) times daily.     enoxaparin 40 MG/0.4ML injection  Commonly known as:  LOVENOX  Inject 0.4 mLs (40 mg total) into the skin daily.     ferrous sulfate 325 (65 FE) MG tablet  Take 1 tablet (325 mg total) by mouth 3 (three) times daily after meals.     hydrochlorothiazide 25 MG tablet  Commonly known as:  HYDRODIURIL  Take 25 mg by mouth every morning.      HYDROcodone-acetaminophen 7.5-325 MG per tablet  Commonly known as:  NORCO  Take 1-2 tablets by mouth every 4 (four) hours as needed for moderate pain.     metoprolol succinate 100 MG 24 hr tablet  Commonly known as:  TOPROL-XL  Take 50-100 mg by mouth See admin instructions. Takes one tablet in the morning (100mg ) and half a tablet (50mg ) at night.     MUCINEX CONGEST & COUGH CHILD 2.5-5-100 MG/5ML Liqd  Generic drug:  Phenylephrine-DM-GG  Take 5 mLs by mouth once as needed (congestion.).     polyethylene glycol packet  Commonly known as:  MIRALAX / GLYCOLAX  Take 17 g by mouth 2 (two) times daily.     potassium chloride 10 MEQ tablet  Commonly known as:  K-DUR  Take 10 mEq by mouth 2 (two) times daily.         Signed: Anastasio Auerbach. Iisha Soyars   PA-C  04/15/2014, 8:31 AM

## 2016-02-01 ENCOUNTER — Encounter (HOSPITAL_COMMUNITY): Payer: Self-pay

## 2016-02-01 ENCOUNTER — Encounter (HOSPITAL_COMMUNITY)
Admission: RE | Admit: 2016-02-01 | Discharge: 2016-02-01 | Disposition: A | Discharge status: Home / Self Care | Payer: Medicare Other | Source: Ambulatory Visit | Attending: Orthopedic Surgery | Admitting: Orthopedic Surgery

## 2016-02-01 DIAGNOSIS — Z01818 Encounter for other preprocedural examination: Secondary | ICD-10-CM | POA: Insufficient documentation

## 2016-02-01 LAB — SURGICAL PCR SCREEN
MRSA, PCR: NEGATIVE
Staphylococcus aureus: POSITIVE — AB

## 2016-02-01 NOTE — Patient Instructions (Signed)
Kari Flores  02/01/2016   Your procedure is scheduled on: 02/07/16  Report to [facility]  Entrance take [facility]  elevators to 3rd floor to  Short Stay Center at 8:30 AM.  Call this number if you have problems the morning of surgery [phone]   Remember: ONLY 1 PERSON MAY GO WITH YOU TO SHORT STAY TO GET  READY MORNING OF YOUR SURGERY.  Do not eat food or drink liquids :After Midnight MONDAY     Take these medicines the morning of surgery with A SIP OF WATER: Metoprolol                                You may not have any metal on your body including hair pins and              piercings  Do not wear jewelry, make-up, lotions, powders or perfumes, deodorant             Do not wear nail polish.  Do not shave  48 hours prior to surgery.                 Do not bring valuables to the hospital. [facility] IS NOT             RESPONSIBLE   FOR VALUABLES.  Contacts, dentures or bridgework may not be worn into surgery.  Leave suitcase in the car. After surgery it may be brought to your room.     _____________________________________________________________________             [facility] - Preparing for Surgery Before surgery, you can play an important role.  Because skin is not sterile, your skin needs to be as free of germs as possible.  You can reduce the number of germs on your skin by washing with CHG (chlorahexidine gluconate) soap before surgery.  CHG is an antiseptic cleaner which kills germs and bonds with the skin to continue killing germs even after washing. Please DO NOT use if you have an allergy to CHG or antibacterial soaps.  If your skin becomes reddened/irritated stop using the CHG and inform your nurse when you arrive at Short Stay. Do not shave (including legs and underarms) for at least 48 hours prior to the first CHG shower.  You may shave your face/neck. Please follow these instructions carefully:  1.  Shower with CHG Soap the night  before surgery and the  morning of Surgery.  2.  If you choose to wash your hair, wash your hair first as usual with your  normal  shampoo.  3.  After you shampoo, rinse your hair and body thoroughly to remove the  shampoo.                           4.  Use CHG as you would any other liquid soap.  You can apply chg directly  to the skin and wash                       Gently with a scrungie or clean washcloth.  5.  Apply the CHG Soap to your body ONLY FROM THE NECK DOWN.   Do not use on face/ open  Wound or open sores. Avoid contact with eyes, ears mouth and genitals (private parts).                       Wash face,  Genitals (private parts) with your normal soap.             6.  Wash thoroughly, paying special attention to the area where your surgery  will be performed.  7.  Thoroughly rinse your body with warm water from the neck down.  8.  DO NOT shower/wash with your normal soap after using and rinsing off  the CHG Soap.                9.  Pat yourself dry with a clean towel.            10.  Wear clean pajamas.            11.  Place clean sheets on your bed the night of your first shower and do not  sleep with pets. Day of Surgery : Do not apply any lotions/deodorants the morning of surgery.  Please wear clean clothes to the hospital/surgery center.  FAILURE TO FOLLOW THESE INSTRUCTIONS MAY RESULT IN THE CANCELLATION OF YOUR SURGERY PATIENT SIGNATURE_________________________________  NURSE SIGNATURE__________________________________  ________________________________________________________________________   [name]  An incentive spirometer is a tool that can help keep your lungs clear and active. This tool measures how well you are filling your lungs with each breath. Taking long deep breaths may help reverse or decrease the chance of developing breathing (pulmonary) problems (especially infection) following:  A long period of time when you are  unable to move or be active. BEFORE THE PROCEDURE   If the spirometer includes an indicator to show your best effort, your nurse or respiratory therapist will set it to a desired goal.  If possible, sit up straight or lean slightly forward. Try not to slouch.  Hold the incentive spirometer in an upright position. INSTRUCTIONS FOR USE  1. Sit on the edge of your bed if possible, or sit up as far as you can in bed or on a chair. 2. Hold the incentive spirometer in an upright position. 3. Breathe out normally. 4. Place the mouthpiece in your mouth and seal your lips tightly around it. 5. Breathe in slowly and as deeply as possible, raising the piston or the ball toward the top of the column. 6. Hold your breath for 3-5 seconds or for as long as possible. Allow the piston or ball to fall to the bottom of the column. 7. Remove the mouthpiece from your mouth and breathe out normally. 8. Rest for a few seconds and repeat Steps 1 through 7 at least 10 times every 1-2 hours when you are awake. Take your time and take a few normal breaths between deep breaths. 9. The spirometer may include an indicator to show your best effort. Use the indicator as a goal to work toward during each repetition. 10. After each set of 10 deep breaths, practice coughing to be sure your lungs are clear. If you have an incision (the cut made at the time of surgery), support your incision when coughing by placing a pillow or rolled up towels firmly against it. Once you are able to get out of bed, walk around indoors and cough well. You may stop using the incentive spirometer when instructed by your caregiver.  RISKS AND COMPLICATIONS  Take your time so you do not get  dizzy or light-headed.  If you are in pain, you may need to take or ask for pain medication before doing incentive spirometry. It is harder to take a deep breath if you are having pain. AFTER USE  Rest and breathe slowly and easily.  It can be helpful to  keep track of a log of your progress. Your caregiver can provide you with a simple table to help with this. If you are using the spirometer at home, follow these instructions: [place] IF:   You are having difficultly using the spirometer.  You have trouble using the spirometer as often as instructed.  Your pain medication is not giving enough relief while using the spirometer.  You develop fever of 100.5 F (38.1 C) or higher. SEEK IMMEDIATE MEDICAL CARE IF:   You cough up bloody sputum that had not been present before.  You develop fever of 102 F (38.9 C) or greater.  You develop worsening pain at or near the incision site. MAKE SURE YOU:   Understand these instructions.  Will watch your condition.  Will get help right away if you are not doing well or get worse. Document Released: 11/05/2006 Document Revised: 09/17/2011 Document Reviewed: 01/06/2007 [facility] Patient Information 2014 [place], [place].   ________________________________________________________________________

## 2016-02-01 NOTE — Pre-Procedure Instructions (Signed)
PCR result routed to Dr. Charlann Boxer

## 2016-02-01 NOTE — Pre-Procedure Instructions (Signed)
Labs done July 2017 in chart- CBC, BMP

## 2016-02-01 NOTE — Pre-Procedure Instructions (Signed)
Called patient's home phone number and left message for her to pick up Mupirocin at her pharmacy- [facility] in [place], [place]

## 2016-02-02 NOTE — H&P (Signed)
TOTAL HIP ADMISSION H&P  Patient is admitted for right total hip arthroplasty, anterior approach.  Subjective:  Chief Complaint:   Right hip primary OA /pain  HPI: Kari Flores, 75 y.o. female, has a history of pain and functional disability in the right hip(s) due to arthritis and patient has failed non-surgical conservative treatments for greater than 12 weeks to include NSAID's and/or analgesics and activity modification.  Onset of symptoms was gradual starting 2+ months ago with rapidlly worsening course since that time.The patient noted no past surgery on the right hip(s).  Patient currently rates pain in the right hip at 10 out of 10 with activity. Patient has night pain, worsening of pain with activity and weight bearing, trendelenberg gait, pain that interfers with activities of daily living and pain with passive range of motion. Patient has evidence of periarticular osteophytes and joint space narrowing by imaging studies. This condition presents safety issues increasing the risk of falls.  There is no current active infection.  Risks, benefits and expectations were discussed with the patient.  Risks including but not limited to the risk of anesthesia, blood clots, nerve damage, blood vessel damage, failure of the prosthesis, infection and up to and including death.  Patient understand the risks, benefits and expectations and wishes to proceed with surgery.   PCP: Deloris Ping, MD  D/C Plans:      Home  Post-op Meds:       No Rx given   Tranexamic Acid:      To be given - topically  (previous DVT)  Decadron:      Is to be given  FYI:     Xarelto  Tramadol and APAP    Patient Active Problem List   Diagnosis Date Noted  . 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 Arthritis   . GERD (gastroesophageal reflux disease)   . H/O blood clots    after knee surgery -1 st surgery right knee-in lower leg  . Hypertension   . Stroke  [facility])    no deficits-on Aggrenox- sometime before first knee surgery    Past Surgical History:  Procedure Laterality Date  . CYSTOSCOPY WITH STENT PLACEMENT     years ago  . JOINT REPLACEMENT     right knee  . TONSILLECTOMY    . TOTAL KNEE REVISION Right 04/12/2014   Procedure: REVISION RIGHT TOTAL KNEE REPLACEMENT/TIABIAL TRAY VS TOTAL;  Surgeon: Shelda Pal, MD;  Location: [facility];  Service: Orthopedics;  Laterality: Right;  . 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   No Known Allergies  Social History  Substance Use Topics  . Smoking status: Never Smoker  . Smokeless tobacco: Never Used  . Alcohol use Yes     Comment: socially    No family history on file.   Review of Systems  Constitutional: Negative.   HENT: Negative.   Eyes: Negative.   Respiratory: Negative.   Cardiovascular: Negative.   Gastrointestinal: Positive for heartburn.  Genitourinary: Negative.   Musculoskeletal: Positive for joint pain.  Skin: Negative.   Neurological: Negative.   Endo/Heme/Allergies: Negative.   Psychiatric/Behavioral: Negative.     Objective:  Physical Exam  Constitutional: She is oriented to person, place, and time. She appears well-developed.  HENT:  Head: Normocephalic.  Eyes: Pupils are equal, round, and reactive to light.  Neck: Neck supple. No JVD present.  No tracheal deviation present. No thyromegaly present.  Cardiovascular: Normal rate, regular rhythm, normal heart sounds and intact distal pulses.   Respiratory: Effort normal and breath sounds normal. No respiratory distress. She has no wheezes.  GI: Soft. There is no tenderness. There is no guarding.  Musculoskeletal:       Right hip: She exhibits decreased range of motion, decreased strength, tenderness and bony tenderness. She exhibits no swelling and no laceration.  Lymphadenopathy:    She has no cervical adenopathy.  Neurological: She is alert and oriented to person,  place, and time.  Skin: Skin is warm and dry.  Psychiatric: She has a normal mood and affect.    Vital signs in last 24 hours: Temp:  [98.6 F (37 C)] 98.6 F (37 C) (07/26 1148) Pulse Rate:  [63] 63 (07/26 1148) Resp:  [18] 18 (07/26 1148) BP: (160)/(80) 160/80 (07/26 1148) SpO2:  [100 %] 100 % (07/26 1148) Weight:  [65.8 kg (145 lb)] 65.8 kg (145 lb) (07/26 1148)  Labs:   Estimated body mass index is 24.89 kg/m as calculated from the following:   Height as of 02/01/16: 5\' 4"  (1.626 m).   Weight as of 02/01/16: 65.8 kg (145 lb).   Imaging Review Plain radiographs demonstrate severe degenerative joint disease of the right hip(s). The bone quality appears to be good for age and reported activity level.  Assessment/Plan:  End stage arthritis, right hip(s)  The patient history, physical examination, clinical judgement of the provider and imaging studies are consistent with end stage degenerative joint disease of the right hip(s) and total hip arthroplasty is deemed medically necessary. The treatment options including medical management, injection therapy, arthroscopy and arthroplasty were discussed at length. The risks and benefits of total hip arthroplasty were presented and reviewed. The risks due to aseptic loosening, infection, stiffness, dislocation/subluxation,  thromboembolic complications and other imponderables were discussed.  The patient acknowledged the explanation, agreed to proceed with the plan and consent was signed. Patient is being admitted for inpatient treatment for surgery, pain control, PT, OT, prophylactic antibiotics, VTE prophylaxis, progressive ambulation and ADL's and discharge planning.The patient is planning to be discharged home.      Anastasio Auerbach Faye Strohman   PA-C  02/02/2016, 9:20 AM

## 2016-02-07 ENCOUNTER — Inpatient Hospital Stay (HOSPITAL_COMMUNITY): Payer: Medicare Other

## 2016-02-07 ENCOUNTER — Inpatient Hospital Stay (HOSPITAL_COMMUNITY)
Admission: RE | Admit: 2016-02-07 | Discharge: 2016-02-08 | DRG: 470 | Disposition: A | Discharge status: Home Health | Payer: Medicare Other | Source: Ambulatory Visit | Attending: Orthopedic Surgery | Admitting: Orthopedic Surgery

## 2016-02-07 ENCOUNTER — Inpatient Hospital Stay (HOSPITAL_COMMUNITY): Payer: Medicare Other | Admitting: Anesthesiology

## 2016-02-07 ENCOUNTER — Encounter (HOSPITAL_COMMUNITY)
Admission: RE | Disposition: A | Discharge status: Home Health | Payer: Self-pay | Source: Ambulatory Visit | Attending: Orthopedic Surgery

## 2016-02-07 ENCOUNTER — Encounter (HOSPITAL_COMMUNITY): Payer: Self-pay | Admitting: *Deleted

## 2016-02-07 DIAGNOSIS — Z96649 Presence of unspecified artificial hip joint: Secondary | ICD-10-CM

## 2016-02-07 DIAGNOSIS — I10 Essential (primary) hypertension: Secondary | ICD-10-CM | POA: Diagnosis present

## 2016-02-07 DIAGNOSIS — K219 Gastro-esophageal reflux disease without esophagitis: Secondary | ICD-10-CM | POA: Diagnosis present

## 2016-02-07 DIAGNOSIS — Z96651 Presence of right artificial knee joint: Secondary | ICD-10-CM | POA: Diagnosis present

## 2016-02-07 DIAGNOSIS — M25551 Pain in right hip: Secondary | ICD-10-CM | POA: Diagnosis present

## 2016-02-07 DIAGNOSIS — M1611 Unilateral primary osteoarthritis, right hip: Secondary | ICD-10-CM | POA: Diagnosis present

## 2016-02-07 DIAGNOSIS — Z8673 Personal history of transient ischemic attack (TIA), and cerebral infarction without residual deficits: Secondary | ICD-10-CM | POA: Diagnosis not present

## 2016-02-07 DIAGNOSIS — Z86718 Personal history of other venous thrombosis and embolism: Secondary | ICD-10-CM

## 2016-02-07 HISTORY — PX: TOTAL HIP ARTHROPLASTY: SHX124

## 2016-02-07 LAB — TYPE AND SCREEN
ABO/RH(D): O POS
ANTIBODY SCREEN: NEGATIVE

## 2016-02-07 SURGERY — ARTHROPLASTY, HIP, TOTAL, ANTERIOR APPROACH
Anesthesia: Spinal | Site: Hip | Laterality: Right

## 2016-02-07 MED ORDER — TRANEXAMIC ACID 1000 MG/10ML IV SOLN
2000.0000 mg | Freq: Once | INTRAVENOUS | Status: DC
  Filled 2016-02-07: qty 20

## 2016-02-07 MED ORDER — HYDROMORPHONE HCL 1 MG/ML IJ SOLN
0.2500 mg | INTRAMUSCULAR | Status: DC | PRN
  Administered 2016-02-07 (×2): 0.5 mg via INTRAVENOUS

## 2016-02-07 MED ORDER — BISACODYL 10 MG RE SUPP: 10.0000 mg | Freq: Every day | RECTAL | Status: DC | PRN

## 2016-02-07 MED ORDER — HYDROMORPHONE HCL 1 MG/ML IJ SOLN: 0.5000 mg | INTRAMUSCULAR | Status: DC | PRN

## 2016-02-07 MED ORDER — FENTANYL CITRATE (PF) 100 MCG/2ML IJ SOLN
INTRAMUSCULAR | Status: AC
  Filled 2016-02-07: qty 2

## 2016-02-07 MED ORDER — METHOCARBAMOL 500 MG PO TABS
500.0000 mg | ORAL_TABLET | Freq: Four times a day (QID) | ORAL | Status: DC | PRN
  Administered 2016-02-08: 500 mg via ORAL
  Filled 2016-02-07: qty 1

## 2016-02-07 MED ORDER — DEXAMETHASONE SODIUM PHOSPHATE 10 MG/ML IJ SOLN
10.0000 mg | Freq: Once | INTRAMUSCULAR | Status: AC
  Administered 2016-02-08: 10 mg via INTRAVENOUS
  Filled 2016-02-07: qty 1

## 2016-02-07 MED ORDER — METHOCARBAMOL 1000 MG/10ML IJ SOLN
500.0000 mg | Freq: Four times a day (QID) | INTRAVENOUS | Status: DC | PRN
  Administered 2016-02-07: 500 mg via INTRAVENOUS
  Filled 2016-02-07: qty 550
  Filled 2016-02-07: qty 5

## 2016-02-07 MED ORDER — MEPERIDINE HCL 50 MG/ML IJ SOLN: 6.2500 mg | INTRAMUSCULAR | Status: DC | PRN

## 2016-02-07 MED ORDER — BUPIVACAINE IN DEXTROSE 0.75-8.25 % IT SOLN
INTRATHECAL | Status: DC | PRN
  Administered 2016-02-07: 1.6 mL via INTRATHECAL

## 2016-02-07 MED ORDER — FERROUS SULFATE 325 (65 FE) MG PO TABS: 325.0000 mg | ORAL_TABLET | Freq: Three times a day (TID) | ORAL | Status: DC

## 2016-02-07 MED ORDER — DEXAMETHASONE SODIUM PHOSPHATE 10 MG/ML IJ SOLN
INTRAMUSCULAR | Status: DC | PRN
  Administered 2016-02-07: 10 mg via INTRAVENOUS

## 2016-02-07 MED ORDER — HYDROMORPHONE HCL 1 MG/ML IJ SOLN
INTRAMUSCULAR | Status: AC
  Filled 2016-02-07: qty 1

## 2016-02-07 MED ORDER — METOCLOPRAMIDE HCL 5 MG/ML IJ SOLN: 10.0000 mg | Freq: Once | INTRAMUSCULAR | Status: DC | PRN

## 2016-02-07 MED ORDER — SODIUM CHLORIDE 0.9 % IV SOLN
100.0000 mL/h | INTRAVENOUS | Status: DC
  Administered 2016-02-07 – 2016-02-08 (×2): 100 mL/h via INTRAVENOUS
  Filled 2016-02-07 (×4): qty 1000

## 2016-02-07 MED ORDER — FENTANYL CITRATE (PF) 100 MCG/2ML IJ SOLN: 25.0000 ug | INTRAMUSCULAR | Status: DC | PRN

## 2016-02-07 MED ORDER — PHENOL 1.4 % MT LIQD
1.0000 | OROMUCOSAL | Status: DC | PRN
  Filled 2016-02-07: qty 177

## 2016-02-07 MED ORDER — MIDAZOLAM HCL 5 MG/5ML IJ SOLN
INTRAMUSCULAR | Status: DC | PRN
  Administered 2016-02-07 (×2): 1 mg via INTRAVENOUS

## 2016-02-07 MED ORDER — MIDAZOLAM HCL 2 MG/2ML IJ SOLN
INTRAMUSCULAR | Status: AC
  Filled 2016-02-07: qty 2

## 2016-02-07 MED ORDER — PROPOFOL 500 MG/50ML IV EMUL
INTRAVENOUS | Status: DC | PRN
  Administered 2016-02-07: 100 ug/kg/min via INTRAVENOUS

## 2016-02-07 MED ORDER — MAGNESIUM CITRATE PO SOLN: 1.0000 | Freq: Once | ORAL | Status: DC | PRN

## 2016-02-07 MED ORDER — POLYETHYLENE GLYCOL 3350 17 G PO PACK
17.0000 g | PACK | Freq: Two times a day (BID) | ORAL | Status: DC
  Administered 2016-02-07: 17 g via ORAL
  Filled 2016-02-07: qty 1

## 2016-02-07 MED ORDER — ONDANSETRON HCL 4 MG/2ML IJ SOLN
INTRAMUSCULAR | Status: AC
  Filled 2016-02-07: qty 2

## 2016-02-07 MED ORDER — ATORVASTATIN CALCIUM 20 MG PO TABS
20.0000 mg | ORAL_TABLET | Freq: Every day | ORAL | Status: DC
  Administered 2016-02-07: 20 mg via ORAL
  Filled 2016-02-07: qty 1

## 2016-02-07 MED ORDER — LACTATED RINGERS IV SOLN
INTRAVENOUS | Status: DC | PRN
  Administered 2016-02-07 (×2): via INTRAVENOUS

## 2016-02-07 MED ORDER — METOCLOPRAMIDE HCL 5 MG PO TABS: 5.0000 mg | ORAL_TABLET | Freq: Three times a day (TID) | ORAL | Status: DC | PRN

## 2016-02-07 MED ORDER — METOPROLOL SUCCINATE ER 50 MG PO TB24
100.0000 mg | ORAL_TABLET | Freq: Every day | ORAL | Status: DC
Start: 2016-02-08 — End: 2016-02-08
  Filled 2016-02-07: qty 2

## 2016-02-07 MED ORDER — ONDANSETRON HCL 4 MG/2ML IJ SOLN
INTRAMUSCULAR | Status: DC | PRN
  Administered 2016-02-07: 4 mg via INTRAVENOUS

## 2016-02-07 MED ORDER — CHLORHEXIDINE GLUCONATE 4 % EX LIQD: 60.0000 mL | Freq: Once | CUTANEOUS | Status: DC

## 2016-02-07 MED ORDER — METOCLOPRAMIDE HCL 5 MG/ML IJ SOLN: 5.0000 mg | Freq: Three times a day (TID) | INTRAMUSCULAR | Status: DC | PRN

## 2016-02-07 MED ORDER — RIVAROXABAN 10 MG PO TABS
10.0000 mg | ORAL_TABLET | ORAL | Status: DC
  Administered 2016-02-08: 10 mg via ORAL
  Filled 2016-02-07: qty 1

## 2016-02-07 MED ORDER — ALUM & MAG HYDROXIDE-SIMETH 200-200-20 MG/5ML PO SUSP: 30.0000 mL | ORAL | Status: DC | PRN

## 2016-02-07 MED ORDER — FENTANYL CITRATE (PF) 100 MCG/2ML IJ SOLN
INTRAMUSCULAR | Status: DC | PRN
  Administered 2016-02-07: 50 ug via INTRAVENOUS

## 2016-02-07 MED ORDER — CELECOXIB 200 MG PO CAPS
200.0000 mg | ORAL_CAPSULE | Freq: Two times a day (BID) | ORAL | Status: DC
  Administered 2016-02-07 – 2016-02-08 (×2): 200 mg via ORAL
  Filled 2016-02-07 (×2): qty 1

## 2016-02-07 MED ORDER — LACTATED RINGERS IV SOLN: INTRAVENOUS | Status: DC

## 2016-02-07 MED ORDER — METOPROLOL SUCCINATE ER 50 MG PO TB24
50.0000 mg | ORAL_TABLET | Freq: Every day | ORAL | Status: DC
  Administered 2016-02-07: 50 mg via ORAL
  Filled 2016-02-07: qty 1

## 2016-02-07 MED ORDER — CEFAZOLIN SODIUM-DEXTROSE 2-4 GM/100ML-% IV SOLN
2.0000 g | INTRAVENOUS | Status: AC
  Administered 2016-02-07: 2 g via INTRAVENOUS
  Filled 2016-02-07: qty 100

## 2016-02-07 MED ORDER — PROPOFOL 10 MG/ML IV BOLUS
INTRAVENOUS | Status: AC
  Filled 2016-02-07: qty 40

## 2016-02-07 MED ORDER — ONDANSETRON HCL 4 MG/2ML IJ SOLN: 4.0000 mg | Freq: Four times a day (QID) | INTRAMUSCULAR | Status: DC | PRN

## 2016-02-07 MED ORDER — HYDROCODONE-ACETAMINOPHEN 7.5-325 MG PO TABS
1.0000 | ORAL_TABLET | ORAL | Status: DC
  Administered 2016-02-07: 2 via ORAL
  Administered 2016-02-07: 1 via ORAL
  Administered 2016-02-07: 2 via ORAL
  Administered 2016-02-07: 1 via ORAL
  Administered 2016-02-08 (×3): 2 via ORAL
  Filled 2016-02-07: qty 1
  Filled 2016-02-07 (×2): qty 2
  Filled 2016-02-07: qty 1
  Filled 2016-02-07 (×3): qty 2

## 2016-02-07 MED ORDER — DIPHENHYDRAMINE HCL 25 MG PO CAPS: 25.0000 mg | ORAL_CAPSULE | Freq: Four times a day (QID) | ORAL | Status: DC | PRN

## 2016-02-07 MED ORDER — ONDANSETRON HCL 4 MG PO TABS: 4.0000 mg | ORAL_TABLET | Freq: Four times a day (QID) | ORAL | Status: DC | PRN

## 2016-02-07 MED ORDER — DOCUSATE SODIUM 100 MG PO CAPS
100.0000 mg | ORAL_CAPSULE | Freq: Two times a day (BID) | ORAL | Status: DC
  Administered 2016-02-07 – 2016-02-08 (×2): 100 mg via ORAL
  Filled 2016-02-07 (×2): qty 1

## 2016-02-07 MED ORDER — CEFAZOLIN SODIUM-DEXTROSE 2-4 GM/100ML-% IV SOLN
INTRAVENOUS | Status: AC
  Filled 2016-02-07: qty 100

## 2016-02-07 MED ORDER — HYDROCHLOROTHIAZIDE 12.5 MG PO CAPS
12.5000 mg | ORAL_CAPSULE | Freq: Every day | ORAL | Status: DC
  Administered 2016-02-07: 12.5 mg via ORAL
  Filled 2016-02-07 (×2): qty 1

## 2016-02-07 MED ORDER — PHENYLEPHRINE HCL 10 MG/ML IJ SOLN
INTRAVENOUS | Status: DC | PRN
  Administered 2016-02-07: 50 ug/min via INTRAVENOUS

## 2016-02-07 MED ORDER — AMLODIPINE BESYLATE 5 MG PO TABS
2.5000 mg | ORAL_TABLET | Freq: Every day | ORAL | Status: DC
  Administered 2016-02-07: 2.5 mg via ORAL
  Filled 2016-02-07: qty 1

## 2016-02-07 MED ORDER — CEFAZOLIN SODIUM-DEXTROSE 2-4 GM/100ML-% IV SOLN
2.0000 g | Freq: Four times a day (QID) | INTRAVENOUS | Status: AC
  Administered 2016-02-07 (×2): 2 g via INTRAVENOUS
  Filled 2016-02-07 (×2): qty 100

## 2016-02-07 MED ORDER — TRANEXAMIC ACID 1000 MG/10ML IV SOLN
INTRAVENOUS | Status: DC | PRN
  Administered 2016-02-07: 2000 mg via TOPICAL

## 2016-02-07 MED ORDER — SODIUM CHLORIDE 0.9 % IR SOLN
Status: DC | PRN
  Administered 2016-02-07: 1000 mL

## 2016-02-07 MED ORDER — MENTHOL 3 MG MT LOZG: 1.0000 | LOZENGE | OROMUCOSAL | Status: DC | PRN

## 2016-02-07 MED ORDER — DEXAMETHASONE SODIUM PHOSPHATE 10 MG/ML IJ SOLN
INTRAMUSCULAR | Status: AC
  Filled 2016-02-07: qty 1

## 2016-02-07 SURGICAL SUPPLY — 36 items
BAG DECANTER FOR FLEXI CONT (MISCELLANEOUS) ×2 IMPLANT
BAG SPEC THK2 15X12 ZIP CLS (MISCELLANEOUS)
BAG ZIPLOCK 12X15 (MISCELLANEOUS) IMPLANT
CAPT HIP TOTAL 2 ×2 IMPLANT
CLOTH BEACON ORANGE TIMEOUT ST (SAFETY) ×3 IMPLANT
COVER PERINEAL POST (MISCELLANEOUS) ×3 IMPLANT
DRAPE STERI IOBAN 125X83 (DRAPES) ×3 IMPLANT
DRAPE U-SHAPE 47X51 STRL (DRAPES) ×6 IMPLANT
DRESSING AQUACEL AG SP 3.5X10 (GAUZE/BANDAGES/DRESSINGS) ×1 IMPLANT
DRSG AQUACEL AG SP 3.5X10 (GAUZE/BANDAGES/DRESSINGS) ×3
DURAPREP 26ML APPLICATOR (WOUND CARE) ×3 IMPLANT
ELECT REM PT RETURN 15FT ADLT (MISCELLANEOUS) IMPLANT
ELECT REM PT RETURN 9FT ADLT (ELECTROSURGICAL) ×3
ELECTRODE REM PT RTRN 9FT ADLT (ELECTROSURGICAL) ×1 IMPLANT
GLOVE BIOGEL M STRL SZ7.5 (GLOVE) IMPLANT
GLOVE BIOGEL PI IND STRL 7.5 (GLOVE) ×1 IMPLANT
GLOVE BIOGEL PI IND STRL 8.5 (GLOVE) ×1 IMPLANT
GLOVE BIOGEL PI INDICATOR 7.5 (GLOVE) ×2
GLOVE BIOGEL PI INDICATOR 8.5 (GLOVE) ×2
GLOVE ECLIPSE 8.0 STRL XLNG CF (GLOVE) ×4 IMPLANT
GLOVE ORTHO TXT STRL SZ7.5 (GLOVE) ×7 IMPLANT
GOWN STRL REUS W/TWL LRG LVL3 (GOWN DISPOSABLE) ×3 IMPLANT
GOWN STRL REUS W/TWL XL LVL3 (GOWN DISPOSABLE) ×3 IMPLANT
HOLDER FOLEY CATH W/STRAP (MISCELLANEOUS) ×3 IMPLANT
LIQUID BAND (GAUZE/BANDAGES/DRESSINGS) ×3 IMPLANT
PACK ANTERIOR HIP CUSTOM (KITS) ×3 IMPLANT
SAW OSC TIP CART 19.5X105X1.3 (SAW) ×3 IMPLANT
SUT MNCRL AB 4-0 PS2 18 (SUTURE) ×3 IMPLANT
SUT VIC AB 1 CT1 36 (SUTURE) ×9 IMPLANT
SUT VIC AB 2-0 CT1 27 (SUTURE) ×6
SUT VIC AB 2-0 CT1 TAPERPNT 27 (SUTURE) ×2 IMPLANT
SUT VLOC 180 0 24IN GS25 (SUTURE) ×3 IMPLANT
TRAY FOLEY W/METER SILVER 14FR (SET/KITS/TRAYS/PACK) ×2 IMPLANT
TRAY FOLEY W/METER SILVER 16FR (SET/KITS/TRAYS/PACK) IMPLANT
WATER STERILE IRR 1500ML POUR (IV SOLUTION) ×3 IMPLANT
YANKAUER SUCT BULB TIP 10FT TU (MISCELLANEOUS) ×2 IMPLANT

## 2016-02-07 NOTE — Interval H&P Note (Signed)
History and Physical Interval Note:  02/07/2016 10:10 AM  Kari Flores  has presented today for surgery, with the diagnosis of RIGHT HIP OA  The various methods of treatment have been discussed with the patient and family. After consideration of risks, benefits and other options for treatment, the patient has consented to  Procedure(s): RIGHT TOTAL HIP ARTHROPLASTY ANTERIOR APPROACH (Right) as a surgical intervention .  The patient's history has been reviewed, patient examined, no change in status, stable for surgery.  I have reviewed the patient's chart and labs.  Questions were answered to the patient's satisfaction.     Shelda Pal

## 2016-02-07 NOTE — Anesthesia Postprocedure Evaluation (Signed)
Anesthesia Post Note  Patient: Kari Flores  Procedure(s) Performed: Procedure(s) (LRB): RIGHT TOTAL HIP ARTHROPLASTY ANTERIOR APPROACH (Right)  Patient location during evaluation: PACU Anesthesia Type: Spinal Level of consciousness: awake and alert Pain management: pain level controlled Vital Signs Assessment: post-procedure vital signs reviewed and stable Respiratory status: spontaneous breathing and respiratory function stable Cardiovascular status: blood pressure returned to baseline and stable Postop Assessment: no headache, no backache and spinal receding Anesthetic complications: no    Last Vitals:  Vitals:   02/07/16 1300 02/07/16 1345  BP:  (!) 106/57  Pulse:  (!) 51  Resp:  13  Temp: 36.7 C 36.7 C    Last Pain:  Vitals:   02/07/16 1345  TempSrc:   PainSc: 2                  Phillips Grout

## 2016-02-07 NOTE — Transfer of Care (Signed)
Immediate Anesthesia Transfer of Care Note  Patient: Kari Flores  Procedure(s) Performed: Procedure(s): RIGHT TOTAL HIP ARTHROPLASTY ANTERIOR APPROACH (Right)  Patient Location: PACU  Anesthesia Type:Spinal  Level of Consciousness: awake, alert  and oriented  Airway & Oxygen Therapy: Patient Spontanous Breathing and Patient connected to face mask oxygen  Post-op Assessment: Report given to RN and Post -op Vital signs reviewed and stable  Post vital signs: Reviewed and stable  Last Vitals:  Vitals:   02/07/16 0828  BP: (!) 157/72  Pulse: 73  Resp: 16  Temp: 37.1 C    Last Pain:  Vitals:   02/07/16 0828  TempSrc: Oral         Complications: No apparent anesthesia complications

## 2016-02-07 NOTE — Anesthesia Preprocedure Evaluation (Signed)
Anesthesia Evaluation  Patient identified by MRN, date of birth, ID band Patient awake    Reviewed: Allergy & Precautions, NPO status , Patient's Chart, lab work & pertinent test results  Airway Mallampati: II  TM Distance: >3 FB Neck ROM: Full    Dental no notable dental hx.    Pulmonary neg pulmonary ROS,    Pulmonary exam normal breath sounds clear to auscultation       Cardiovascular hypertension, Pt. on medications Normal cardiovascular exam Rhythm:Regular Rate:Normal     Neuro/Psych TIAnegative psych ROS   GI/Hepatic negative GI ROS, Neg liver ROS,   Endo/Other  negative endocrine ROS  Renal/GU negative Renal ROS  negative genitourinary   Musculoskeletal negative musculoskeletal ROS (+)   Abdominal   Peds negative pediatric ROS (+)  Hematology negative hematology ROS (+)   Anesthesia Other Findings   Reproductive/Obstetrics negative OB ROS                             Anesthesia Physical Anesthesia Plan  ASA: II  Anesthesia Plan: Spinal   Post-op Pain Management:    Induction:   Airway Management Planned: Simple Face Mask  Additional Equipment:   Intra-op Plan:   Post-operative Plan:   Informed Consent: I have reviewed the patients History and Physical, chart, labs and discussed the procedure including the risks, benefits and alternatives for the proposed anesthesia with the patient or authorized representative who has indicated his/her understanding and acceptance.   Dental advisory given  Plan Discussed with: CRNA  Anesthesia Plan Comments:         Anesthesia Quick Evaluation

## 2016-02-07 NOTE — Anesthesia Procedure Notes (Addendum)
Spinal  Patient location during procedure: OR Start time: 02/07/2016 11:05 AM End time: 02/07/2016 11:14 AM Staffing Anesthesiologist: Montez Hageman Resident/CRNA: Danley Danker L Performed: anesthesiologist  Preanesthetic Checklist Completed: patient identified, site marked, surgical consent, pre-op evaluation, timeout performed, IV checked, risks and benefits discussed and monitors and equipment checked Spinal Block Patient position: sitting Prep: Betadine Patient monitoring: heart rate, continuous pulse ox and blood pressure Approach: right paramedian Location: L3-4 Needle Needle type: Spinocan  Needle gauge: 22 G Assessment Sensory level: T6 Additional Notes Kit expiration date 06/07/2017 and lot # [id number] Clear CSF, negative heme, negative paresthesia Returned to supine position and tolerated well

## 2016-02-08 LAB — CBC
HEMATOCRIT: 31.4 % — AB (ref 36.0–46.0)
HEMOGLOBIN: 10.5 g/dL — AB (ref 12.0–15.0)
MCH: 30.9 pg (ref 26.0–34.0)
MCHC: 33.4 g/dL (ref 30.0–36.0)
MCV: 92.4 fL (ref 78.0–100.0)
PLATELETS: 188 10*3/uL (ref 150–400)
RBC: 3.4 MIL/uL — AB (ref 3.87–5.11)
RDW: 13.9 % (ref 11.5–15.5)
WBC: 11.6 10*3/uL — AB (ref 4.0–10.5)

## 2016-02-08 LAB — BASIC METABOLIC PANEL
ANION GAP: 6 (ref 5–15)
BUN: 12 mg/dL (ref 6–20)
CHLORIDE: 106 mmol/L (ref 101–111)
CO2: 25 mmol/L (ref 22–32)
Calcium: 8.2 mg/dL — ABNORMAL LOW (ref 8.9–10.3)
Creatinine, Ser: 0.7 mg/dL (ref 0.44–1.00)
GFR calc Af Amer: >60 mL/min (ref >60)
GLUCOSE: 136 mg/dL — AB (ref 65–99)
POTASSIUM: 4.2 mmol/L (ref 3.5–5.1)
Sodium: 137 mmol/L (ref 135–145)

## 2016-02-08 MED ORDER — HYDROCODONE-ACETAMINOPHEN 7.5-325 MG PO TABS: 1.0000 | ORAL_TABLET | ORAL | 0 refills | Status: DC | PRN

## 2016-02-08 MED ORDER — POLYETHYLENE GLYCOL 3350 17 G PO PACK: 17.0000 g | PACK | Freq: Two times a day (BID) | ORAL | 0 refills | Status: DC

## 2016-02-08 MED ORDER — FERROUS SULFATE 325 (65 FE) MG PO TABS: 325.0000 mg | ORAL_TABLET | Freq: Three times a day (TID) | ORAL | 3 refills | Status: DC

## 2016-02-08 MED ORDER — TIZANIDINE HCL 4 MG PO TABS: 4.0000 mg | ORAL_TABLET | Freq: Four times a day (QID) | ORAL | 0 refills | Status: DC | PRN

## 2016-02-08 MED ORDER — DOCUSATE SODIUM 100 MG PO CAPS: 100.0000 mg | ORAL_CAPSULE | Freq: Two times a day (BID) | ORAL | 0 refills | Status: DC

## 2016-02-08 MED ORDER — RIVAROXABAN 10 MG PO TABS: 10.0000 mg | ORAL_TABLET | ORAL | 0 refills | Status: DC

## 2016-02-08 MED ORDER — ASPIRIN-DIPYRIDAMOLE ER 25-200 MG PO CP12: 1.0000 | ORAL_CAPSULE | Freq: Two times a day (BID) | ORAL | Status: AC

## 2016-02-08 NOTE — Care Management Note (Signed)
Case Management Note  Patient Details  Name: Kari Flores MRN: [id number] Date of Birth: August 16, 1940  Subjective/Objective:                  RIGHT TOTAL HIP ARTHROPLASTY ANTERIOR APPROACH (Right)  Action/Plan: Discharge planning Expected Discharge Date:                  Expected Discharge Plan:  [place]  In-House Referral:     Discharge planning Services  CM Consult  Post Acute Care Choice:  Home Health Choice offered to:  Patient  DME Arranged:  N/A DME Agency:  NA  HH Arranged:  PT [place] Agency:  [facility] (now [facility])  Status of Service:  Completed, signed off  If discussed at [employer] of Stay Meetings, dates discussed:    Additional Comments: CM met with pt in room to offer choice of home health agency. Pt chooses [facility] to render HHPT.  Referral given to [employer], Tim.  Pt states she has both a rolling walker and 3n1 at home.  NO other CM needs were communicated. Dellie Catholic, RN 02/08/2016, 10:45 AM

## 2016-02-08 NOTE — Progress Notes (Signed)
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  Reviewed stairs and car transfers  Follow Up Recommendations  Home health PT     Equipment Recommendations  None recommended by PT    Recommendations for Other Services OT consult     Precautions / Restrictions Precautions Precautions: Fall Restrictions Weight Bearing Restrictions: No Other Position/Activity Restrictions: WBAT    Mobility  Bed Mobility               General bed mobility comments: Pt OOB and declines back to bed  Transfers Overall transfer level: Needs assistance Equipment used: Rolling walker (2 wheeled) Transfers: Sit to/from Stand Sit to Stand: Supervision Stand pivot transfers: Supervision       General transfer comment: cues for LE management and use of UEs to self assist  Ambulation/Gait Ambulation/Gait assistance: Min guard;Supervision Ambulation Distance (Feet): 200 Feet Assistive device: Rolling walker (2 wheeled) Gait Pattern/deviations: Step-to pattern;Step-through pattern;Decreased step length - right;Decreased step length - left;Shuffle;Trunk flexed Gait velocity: decr Gait velocity interpretation: Below normal speed for age/gender General Gait Details: cues for posture, initial sequence and position from RW   Stairs Stairs: Yes Stairs assistance: Min assist Stair Management: No rails;Step to pattern;Forwards;With walker Number of Stairs: 4 General stair comments: steps twice with RW going fwd, cues for sequence and foot/RW placement  Wheelchair Mobility    Modified Rankin (Stroke Patients Only)       Balance                                    Cognition Arousal/Alertness: Awake/alert Behavior During Therapy: WFL for tasks  assessed/performed Overall Cognitive Status: Within Functional Limits for tasks assessed                      Exercises      General Comments        Pertinent Vitals/Pain Pain Assessment: 0-10 Pain Score: 2  Pain Location: R hip Pain Descriptors / Indicators: Aching;Sore Pain Intervention(s): Limited activity within patient's tolerance;Monitored during session;Premedicated before session;Ice applied    Home Living                      Prior Function            PT Goals (current goals can now be found in the care plan section) Acute Rehab PT Goals Patient Stated Goal: Regain IND and walk with less pain PT Goal Formulation: With patient Time For Goal Achievement: 02/10/16 Potential to Achieve Goals: Good Progress towards PT goals: Progressing toward goals    Frequency  7X/week    PT Plan Current plan remains appropriate    Co-evaluation             End of Session Equipment Utilized During Treatment: Gait belt Activity Tolerance: Patient tolerated treatment well Patient left: in chair;with call bell/phone within reach;with chair alarm set     Time: 1350-1413 PT Time Calculation (min) (ACUTE ONLY): 23 min  Charges:  $Gait Training: 8-22 mins $Therapeutic Activity: 8-22 mins                    G Codes:  Kari Flores 02/08/2016, 4:00 PM

## 2016-02-08 NOTE — Evaluation (Signed)
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  Pt should progress to dc home with family assist and HHPT follow up.    Follow Up Recommendations Home health PT    Equipment Recommendations  None recommended by PT    Recommendations for Other Services OT consult     Precautions / Restrictions Precautions Precautions: Fall Restrictions Weight Bearing Restrictions: No Other Position/Activity Restrictions: WBAT      Mobility  Bed Mobility Overal bed mobility: Needs Assistance Bed Mobility: Supine to Sit     Supine to sit: Min assist     General bed mobility comments: cues for sequence and use of L LE to self assist  Transfers Overall transfer level: Needs assistance Equipment used: Rolling walker (2 wheeled) Transfers: Sit to/from Stand Sit to Stand: Min assist         General transfer comment: cues for LE management and use of UEs to self assist  Ambulation/Gait Ambulation/Gait assistance: Min assist;Min guard Ambulation Distance (Feet): 111 Feet Assistive device: Rolling walker (2 wheeled) Gait Pattern/deviations: Step-to pattern;Step-through pattern;Decreased step length - right;Decreased step length - left;Shuffle;Trunk flexed Gait velocity: decr Gait velocity interpretation: Below normal speed for age/gender General Gait Details: cues for posture, sequence and position from [employer]            Wheelchair Mobility    Modified Rankin (Stroke Patients Only)       Balance                                             Pertinent Vitals/Pain Pain Assessment: 0-10 Pain Score: 4  Pain Location: R hip Pain Descriptors / Indicators: Aching;Sore Pain  Intervention(s): Limited activity within patient's tolerance;Monitored during session;Premedicated before session;Ice applied    Home Living Family/patient expects to be discharged to:: Private residence Living Arrangements: Spouse/significant other Available Help at Discharge: Family Type of Home: House Home Access: Stairs to enter Entrance Stairs-Rails: None Entrance Stairs-Number of Steps: 2 Home Layout: Able to live on main level with bedroom/bathroom Home Equipment: Walker - 2 wheels      Prior Function Level of Independence: Independent               Hand Dominance        Extremity/Trunk Assessment   Upper Extremity Assessment: Overall WFL for tasks assessed           Lower Extremity Assessment: RLE deficits/detail RLE Deficits / Details: Strength at hip 2+/5 with AAROM at hip to 80 flex and 20 abd.  Knee flex ltd to ~60 flex    Cervical / Trunk Assessment: Normal  Communication   Communication: No difficulties  Cognition Arousal/Alertness: Awake/alert Behavior During Therapy: WFL for tasks assessed/performed Overall Cognitive Status: Within Functional Limits for tasks assessed                      General Comments      Exercises Total Joint Exercises Ankle Circles/Pumps: AROM;15 reps;Supine;Both Quad Sets: AROM;Both;10 reps;Supine Heel Slides: AAROM;20 reps;Supine;Right Hip ABduction/ADduction: AAROM;Right;15 reps;Supine      Assessment/Plan  PT Assessment Patient needs continued PT services  PT Diagnosis Difficulty walking   PT Problem List Decreased strength;Decreased range of motion;Decreased activity tolerance;Decreased mobility;Decreased knowledge of use of DME;Pain  PT Treatment Interventions DME instruction;Gait training;Stair training;Functional mobility training;Therapeutic activities;Therapeutic exercise;Patient/family education   PT Goals (Current goals can be found in the Care Plan section) Acute Rehab PT Goals Patient  Stated Goal: Regain IND and walk with less pain PT Goal Formulation: With patient Time For Goal Achievement: 02/10/16 Potential to Achieve Goals: Good    Frequency 7X/week   Barriers to discharge        Co-evaluation               End of Session Equipment Utilized During Treatment: Gait belt Activity Tolerance: Patient tolerated treatment well Patient left: in chair;with call bell/phone within reach;with chair alarm set Nurse Communication: Mobility status         Time: 0935-1010 PT Time Calculation (min) (ACUTE ONLY): 35 min   Charges:   PT Evaluation $PT Eval Low Complexity: 1 Procedure PT Treatments $Therapeutic Exercise: 8-22 mins   PT G Codes:        Makala Fetterolf 03/08/16, 12:35 PM

## 2016-02-08 NOTE — Progress Notes (Signed)
     Subjective: 1 Day Post-Op Procedure(s) (LRB): RIGHT TOTAL HIP ARTHROPLASTY ANTERIOR APPROACH (Right)   Patient reports pain as mild, pain controlled.  No events throughout the night.  We have discussed using Xarelto for 2 weeks, then return to Aggrenox.  Ready to be discharged home if she progresses well with PT.   Objective:   VITALS:   Vitals:   02/08/16 0534 02/08/16 0815  BP: (!) 105/52 (!) 105/35  Pulse: (!) 57 60  Resp: 16   Temp: 98.1 F (36.7 C)     Dorsiflexion/Plantar flexion intact Incision: dressing C/D/I No cellulitis present Compartment soft  LABS  Recent Labs  02/08/16 0428  HGB 10.5*  HCT 31.4*  WBC 11.6*  PLT 188     Recent Labs  02/08/16 0428  NA 137  K 4.2  BUN 12  CREATININE 0.70  GLUCOSE 136*     Assessment/Plan: 1 Day Post-Op Procedure(s) (LRB): RIGHT TOTAL HIP ARTHROPLASTY ANTERIOR APPROACH (Right) Foley cath d/c'ed Advance diet Up with therapy D/C IV fluids Discharge home with home health  Follow up in 2 [facility]. Follow up with OLIN,Shaydon Lease D in 2 weeks.  Contact information:  [facility] [address] [place] [address] [phone]          Anastasio Auerbach. Maico Mulvehill   PAC  02/08/2016, 9:14 AM

## 2016-02-08 NOTE — Evaluation (Signed)
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               Precautions / Restrictions Precautions Precautions: Fall Restrictions Weight Bearing Restrictions: No Other Position/Activity Restrictions: WBAT      Mobility Bed Mobility Overal bed mobility: Needs Assistance Bed Mobility: Supine to Sit     Supine to sit: Min assist     General bed mobility comments: pt in chair  Transfers Overall transfer level: Needs assistance Equipment used: Rolling walker (2 wheeled) Transfers: Sit to/from [employer] Sit to Stand: Supervision Stand pivot transfers: Supervision       General transfer comment: cues for LE management and use of UEs to self assist         ADL Overall ADL's : Needs assistance/impaired     Grooming: Set up;Sitting           Upper Body Dressing : Set up;Standing   Lower Body Dressing: Set up;Sit to/from stand   Toilet Transfer: Supervision/safety;Comfort height toilet;RW;Ambulation   Toileting- Architect and Hygiene: Supervision/safety       Functional mobility during ADLs: Supervision/safety General ADL Comments: husband will A as needed               Pertinent Vitals/Pain Pain Assessment: 0-10 Pain Score: 2  Pain Location: r hip Pain Descriptors / Indicators: Aching Pain Intervention(s): Monitored during session     Hand Dominance     Extremity/Trunk Assessment Upper Extremity Assessment Upper Extremity Assessment: Overall WFL for tasks assessed   Lower Extremity Assessment Lower Extremity Assessment: RLE deficits/detail RLE Deficits / Details: Strength at hip 2+/5 with AAROM at hip to 80 flex and 20 abd.  Knee flex ltd to ~60 flex   Cervical / Trunk  Assessment Cervical / Trunk Assessment: Normal   Communication Communication Communication: No difficulties   Cognition Arousal/Alertness: Awake/alert Behavior During Therapy: WFL for tasks assessed/performed Overall Cognitive Status: Within Functional Limits for tasks assessed                             Home Living Family/patient expects to be discharged to:: Private residence Living Arrangements: Spouse/significant other Available Help at Discharge: Family Type of Home: House Home Access: Stairs to enter Secretary/administrator of Steps: 2 Entrance Stairs-Rails: None Home Layout: Able to live on main level with bedroom/bathroom     Bathroom Shower/Tub: Producer, television/film/video: Standard     Home Equipment: Environmental consultant - 2 wheels          Prior Functioning/Environment Level of Independence: Independent                      OT Goals(Current goals can be found in the care plan section) Acute Rehab OT Goals Patient Stated Goal: Regain IND and walk with less pain  OT Frequency:                End of Session    Activity Tolerance:  in chair with call bell with in reach    Time: 8887-5797 OT Time Calculation (min): 12 min Charges:  OT General Charges $OT Visit: 1 Procedure OT Evaluation $OT Eval Low Complexity: 1 Procedure G-Codes:  Alba Cory 02/08/2016, 12:41 PM

## 2016-02-08 NOTE — Discharge Instructions (Addendum)
Information on my medicine - XARELTO (Rivaroxaban)  This medication education was reviewed with me or my healthcare representative as part of my discharge preparation.  The pharmacist that spoke with me during my hospital stay was:  Ulyses Amor.   Why was Xarelto prescribed for you? Xarelto was prescribed for you to reduce the risk of blood clots forming after orthopedic surgery. The medical term for these abnormal blood clots is venous thromboembolism (VTE).  What do you need to know about xarelto ? Take your Xarelto ONCE DAILY at the same time every day. You may take it either with or without food.  If you have difficulty swallowing the tablet whole, you may crush it and mix in applesauce just prior to taking your dose.  Take Xarelto exactly as prescribed by your doctor and DO NOT stop taking Xarelto without talking to the doctor who prescribed the medication.  Stopping without other VTE prevention medication to take the place of Xarelto may increase your risk of developing a clot.  After discharge, you should have regular check-up appointments with your healthcare provider that is prescribing your Xarelto.    What do you do if you miss a dose? If you miss a dose, take it as soon as you remember on the same day then continue your regularly scheduled once daily regimen the next day. Do not take two doses of Xarelto on the same day.   Important Safety Information A possible side effect of Xarelto is bleeding. You should call your healthcare provider right away if you experience any of the following: ? Bleeding from an injury or your nose that does not stop. ? Unusual colored urine (red or dark brown) or unusual colored stools (red or black). ? Unusual bruising for unknown reasons. ? A serious fall or if you hit your head (even if there is no bleeding).  Some medicines may interact with Xarelto and might increase your risk of bleeding while on Xarelto. To help avoid this, consult  your healthcare provider or pharmacist prior to using any new prescription or non-prescription medications, including herbals, vitamins, non-steroidal anti-inflammatory drugs (NSAIDs) and supplements.  This website has more information on Xarelto: VisitDestination.com.br.   _______________________________________________________________________________________________________________   INSTRUCTIONS AFTER JOINT REPLACEMENT   o Remove items at home which could result in a fall. This includes throw rugs or furniture in walking pathways o ICE to the affected joint every three hours while awake for 30 minutes at a time, for at least the first 3-5 days, and then as needed for pain and swelling.  Continue to use ice for pain and swelling. You may notice swelling that will progress down to the foot and ankle.  This is normal after surgery.  Elevate your leg when you are not up walking on it.   o Continue to use the breathing machine you got in the hospital (incentive spirometer) which will help keep your temperature down.  It is common for your temperature to cycle up and down following surgery, especially at night when you are not up moving around and exerting yourself.  The breathing machine keeps your lungs expanded and your temperature down.   DIET:  As you were doing prior to hospitalization, we recommend a well-balanced diet.  DRESSING / WOUND CARE / SHOWERING  Keep the surgical dressing until follow up.  The dressing is water proof, so you can shower without any extra covering.  IF THE DRESSING FALLS OFF or the wound gets wet inside, change the dressing with  sterile gauze.  Please use good hand washing techniques before changing the dressing.  Do not use any lotions or creams on the incision until instructed by your surgeon.    ACTIVITY  o Increase activity slowly as tolerated, but follow the weight bearing instructions below.   o No driving for 6 weeks or until further direction given by your  physician.  You cannot drive while taking narcotics.  o No lifting or carrying greater than 10 lbs. until further directed by your surgeon. o Avoid periods of inactivity such as sitting longer than an hour when not asleep. This helps prevent blood clots.  o You may return to work once you are authorized by your doctor.     WEIGHT BEARING   Weight bearing as tolerated with assist device (walker, cane, etc) as directed, use it as long as suggested by your surgeon or therapist, typically at least 4-6 weeks.   EXERCISES  Results after joint replacement surgery are often greatly improved when you follow the exercise, range of motion and muscle strengthening exercises prescribed by your doctor. Safety measures are also important to protect the joint from further injury. Any time any of these exercises cause you to have increased pain or swelling, decrease what you are doing until you are comfortable again and then slowly increase them. If you have problems or questions, call your caregiver or physical therapist for advice.   Rehabilitation is important following a joint replacement. After just a few days of immobilization, the muscles of the leg can become weakened and shrink (atrophy).  These exercises are designed to build up the tone and strength of the thigh and leg muscles and to improve motion. Often times heat used for twenty to thirty minutes before working out will loosen up your tissues and help with improving the range of motion but do not use heat for the first two weeks following surgery (sometimes heat can increase post-operative swelling).   These exercises can be done on a training (exercise) mat, on the floor, on a table or on a bed. Use whatever works the best and is most comfortable for you.    Use music or television while you are exercising so that the exercises are a pleasant break in your day. This will make your life better with the exercises acting as a break in your routine that  you can look forward to.   Perform all exercises about fifteen times, three times per day or as directed.  You should exercise both the operative leg and the other leg as well.  Exercises include:    Quad Sets - Tighten up the muscle on the front of the thigh (Quad) and hold for 5-10 seconds.    Straight Leg Raises - With your knee straight (if you were given a brace, keep it on), lift the leg to 60 degrees, hold for 3 seconds, and slowly lower the leg.  Perform this exercise against resistance later as your leg gets stronger.   Leg Slides: Lying on your back, slowly slide your foot toward your buttocks, bending your knee up off the floor (only go as far as is comfortable). Then slowly slide your foot back down until your leg is flat on the floor again.   Angel Wings: Lying on your back spread your legs to the side as far apart as you can without causing discomfort.   Hamstring Strength:  Lying on your back, push your heel against the floor with your leg straight by  tightening up the muscles of your buttocks.  Repeat, but this time bend your knee to a comfortable angle, and push your heel against the floor.  You may put a pillow under the heel to make it more comfortable if necessary.   A rehabilitation program following joint replacement surgery can speed recovery and prevent re-injury in the future due to weakened muscles. Contact your doctor or a physical therapist for more information on knee rehabilitation.    CONSTIPATION  Constipation is defined medically as fewer than three stools per week and severe constipation as less than one stool per week.  Even if you have a regular bowel pattern at home, your normal regimen is likely to be disrupted due to multiple reasons following surgery.  Combination of anesthesia, postoperative narcotics, change in appetite and fluid intake all can affect your bowels.   YOU MUST use at least one of the following options; they are listed in order of  increasing strength to get the job done.  They are all available over the counter, and you may need to use some, POSSIBLY even all of these options:    Drink plenty of fluids (prune juice may be helpful) and high fiber foods Colace 100 mg by mouth twice a day  Senokot for constipation as directed and as needed Dulcolax (bisacodyl), take with full glass of water  Miralax (polyethylene glycol) once or twice a day as needed.  If you have tried all these things and are unable to have a bowel movement in the first 3-4 days after surgery call either your surgeon or your primary doctor.    If you experience loose stools or diarrhea, hold the medications until you stool forms back up.  If your symptoms do not get better within 1 week or if they get worse, check with your doctor.  If you experience "the worst abdominal pain ever" or develop nausea or vomiting, please contact the office immediately for further recommendations for treatment.   ITCHING:  If you experience itching with your medications, try taking only a single pain pill, or even half a pain pill at a time.  You can also use Benadryl over the counter for itching or also to help with sleep.   TED HOSE STOCKINGS:  Use stockings on both legs until for at least 2 weeks or as directed by physician office. They may be removed at night for sleeping.  MEDICATIONS:  See your medication summary on the After Visit Summary that nursing will review with you.  You may have some home medications which will be placed on hold until you complete the course of blood thinner medication.  It is important for you to complete the blood thinner medication as prescribed.  PRECAUTIONS:  If you experience chest pain or shortness of breath - call 911 immediately for transfer to the hospital emergency department.   If you develop a fever greater that 101 F, purulent drainage from wound, increased redness or drainage from wound, foul odor from the wound/dressing, or  calf pain - CONTACT YOUR SURGEON.                                                   FOLLOW-UP APPOINTMENTS:  If you do not already have a post-op appointment, please call the office for an appointment to be seen by your surgeon.  Guidelines for how soon to be seen are listed in your After Visit Summary, but are typically between 1-4 weeks after surgery.  OTHER INSTRUCTIONS:   Knee Replacement:  Do not place pillow under knee, focus on keeping the knee straight while resting.   MAKE SURE YOU:   Understand these instructions.   Get help right away if you are not doing well or get worse.    Thank you for letting us be a part of your medical care team.  It is a privilege we respect greatly.  We hope these instructions will help you stay on track for a fast and full recovery!

## 2016-02-08 NOTE — Op Note (Signed)
NAME:  Kari Flores                ACCOUNT NO.: [id number]      MEDICAL RECORD NO.: [id number]      FACILITY:  [facility]      PHYSICIAN:  Durene Romans D  DATE OF BIRTH:  01/25/1941     DATE OF PROCEDURE:  02/08/2016                                 OPERATIVE REPORT         PREOPERATIVE DIAGNOSIS: Right  hip osteoarthritis.      POSTOPERATIVE DIAGNOSIS:  Right hip osteoarthritis.      PROCEDURE:  Right total hip replacement through an anterior approach   utilizing DePuy THR system, component size 52mm pinnacle cup, a size 36+4 neutral   Altrex liner, a size 0 HI Tri Lock stem with a 36+1.5 delta ceramic   ball.      SURGEON:  Madlyn Frankel. Charlann Boxer, M.D.      ASSISTANT:  Lanney Gins, PA-C     ANESTHESIA:  Spinal.      SPECIMENS:  None.      COMPLICATIONS:  None.      BLOOD LOSS:  500 cc     DRAINS:  None.      INDICATION OF THE PROCEDURE:  Kari Flores is a 75 y.o. female who had   presented to office for evaluation of right hip pain.  Radiographs revealed   progressive degenerative changes with bone-on-bone   articulation to the  hip joint.  The patient had painful limited range of   motion significantly affecting their overall quality of life.  The patient was failing to    respond to conservative measures, and at this point was ready   to proceed with more definitive measures.  The patient has noted progressive   degenerative changes in his hip, progressive problems and dysfunction   with regarding the hip prior to surgery.  Consent was obtained for   benefit of pain relief.  Specific risk of infection, DVT, component   failure, dislocation, need for revision surgery, as well discussion of   the anterior versus posterior approach were reviewed.  Consent was   obtained for benefit of anterior pain relief through an anterior   approach.      PROCEDURE IN DETAIL:  The patient was brought to operative theater.   Once adequate anesthesia,  preoperative antibiotics, 2 gm of Ancef and 10 mg of Decadron (2 gm of Tranexamic Acid administered topically after closure of fascia) administered.   The patient was positioned supine on the OSI Hanna table.  Once adequate   padding of boney process was carried out, we had predraped out the hip, and  used fluoroscopy to confirm orientation of the pelvis and position.      The right hip was then prepped and draped from proximal iliac crest to   mid thigh with shower curtain technique.      Time-out was performed identifying the patient, planned procedure, and   extremity.     An incision was then made 2 cm distal and lateral to the   anterior superior iliac spine extending over the orientation of the   tensor fascia lata muscle and sharp dissection was carried down to the   fascia of the muscle and protractor placed in the soft tissues.  The fascia was then incised.  The muscle belly was identified and swept   laterally and retractor placed along the superior neck.  Following   cauterization of the circumflex vessels and removing some pericapsular   fat, a second cobra retractor was placed on the inferior neck.  A third   retractor was placed on the anterior acetabulum after elevating the   anterior rectus.  A L-capsulotomy was along the line of the   superior neck to the trochanteric fossa, then extended proximally and   distally.  Tag sutures were placed and the retractors were then placed   intracapsular.  We then identified the trochanteric fossa and   orientation of my neck cut, confirmed this radiographically   and then made a neck osteotomy with the femur on traction.  The femoral   head was removed without difficulty or complication.  Traction was let   off and retractors were placed posterior and anterior around the   acetabulum.      The labrum and foveal tissue were debrided.  I began reaming with a 45mm   reamer and reamed up to 51mm reamer with good bony bed  preparation and a 52mm   cup was chosen.  The final 52mm Pinnacle cup was then impacted under fluoroscopy  to confirm the depth of penetration and orientation with respect to   abduction.  A screw was placed followed by the hole eliminator.  The final   36+4 neutral Altrex liner was impacted with good visualized rim fit.  The cup was positioned anatomically within the acetabular portion of the pelvis.      At this point, the femur was rolled at 80 degrees.  Further capsule was   released off the inferior aspect of the femoral neck.  I then   released the superior capsule proximally.  The hook was placed laterally   along the femur and elevated manually and held in position with the bed   hook.  The leg was then extended and adducted with the leg rolled to 100   degrees of external rotation.  Once the proximal femur was fully   exposed, I used a box osteotome to set orientation.  I then began   broaching with the starting chili pepper broach and passed this by hand and then broached up to just the 0.  With the 0 broach in place I chose a high offset neck and did several trial reductions.  The offset was appropriate, leg lengths   appeared to be equal best matched with the +1.5 head ball confirmed radiographically. I did spend time evaluating her pre-operative lengths making sure to best match to the contralateral hip.  Given these findings, I went ahead and dislocated the hip, repositioned all   retractors and positioned the right hip in the extended and abducted position.  The final 0 Hi Tri Lock stem was   chosen and it was impacted down to the level of neck cut.  Based on this   and the trial reduction, a 36+1.5 delta ceramic ball was chosen and   impacted onto a clean and dry trunnion, and the hip was reduced.  The   hip had been irrigated throughout the case again at this point.  I did   reapproximate the superior capsular leaflet to the anterior leaflet   using #1 Vicryl.  The fascia of  the   tensor fascia lata muscle was then reapproximated using #1 Vicryl and #0 V-lock sutures.  The  remaining wound was closed with 2-0 Vicryl and running 4-0 Monocryl.   The hip was cleaned, dried, and dressed sterilely using Dermabond and   Aquacel dressing.  She was then brought   to recovery room in stable condition tolerating the procedure well.    Lanney Gins, PA-C was present for the entirety of the case involved from   preoperative positioning, perioperative retractor management, general   facilitation of the case, as well as primary wound closure as assistant.            [name], M.D.        02/08/2016 10:41 PM

## 2016-02-09 NOTE — Progress Notes (Signed)
CM has called pt but home number quotes a free trip while I wait for the next agent and the mobile number goes straight to voicemail with no identifiers.  CM called [employer] rep, Tim who states SOC cannot be started until pt answers a phone.  CM has called [facility] and has left a direct number for the pt to call for [facility]: Kim at [phone].  No other CM needs were communicated.

## 2016-02-14 NOTE — Discharge Summary (Signed)
Physician Discharge Summary  Patient ID: DEAMBER BUCKHALTER MRN: [id number] DOB/AGE: 1940-10-12 75 y.o.  Admit date: 02/07/2016 Discharge date: 02/08/2016   Procedures:  Procedure(s) (LRB): RIGHT TOTAL HIP ARTHROPLASTY ANTERIOR APPROACH (Right)  Attending Physician:  Dr. Durene Romans   Admission Diagnoses:   Right hip primary OA /pain  Discharge Diagnoses:  Principal Problem:   S/P right THA, AA  Past Medical History:  Diagnosis Date  . Arthritis   . GERD (gastroesophageal reflux disease)   . H/O blood clots    after knee surgery -1 st surgery right knee-in lower leg  . Hypertension   . Stroke (HCC)    no deficits-on Aggrenox- sometime before first knee surgery    HPI:    Ashley Murrain, 75 y.o. female, has a history of pain and functional disability in the right hip(s) due to arthritis and patient has failed non-surgical conservative treatments for greater than 12 weeks to include NSAID's and/or analgesics and activity modification.  Onset of symptoms was gradual starting 2+ months ago with rapidlly worsening course since that time.The patient noted no past surgery on the right hip(s).  Patient currently rates pain in the right hip at 10 out of 10 with activity. Patient has night pain, worsening of pain with activity and weight bearing, trendelenberg gait, pain that interfers with activities of daily living and pain with passive range of motion. Patient has evidence of periarticular osteophytes and joint space narrowing by imaging studies. This condition presents safety issues increasing the risk of falls. There is no current active infection.  Risks, benefits and expectations were discussed with the patient.  Risks including but not limited to the risk of anesthesia, blood clots, nerve damage, blood vessel damage, failure of the prosthesis, infection and up to and including death.  Patient understand the risks, benefits and expectations and wishes to proceed with surgery.   PCP:  Deloris Ping, MD   Discharged Condition: good  Hospital Course:  Patient underwent the above stated procedure on 02/07/2016. Patient tolerated the procedure well and brought to the recovery room in good condition and subsequently to the floor.  POD #1 BP: 105/35 ; Pulse: 60 ; Temp: 98.1 F (36.7 C) ; Resp: 16 Patient reports pain as mild, pain controlled.  No events throughout the night.  We have discussed using Xarelto for 2 weeks, then return to Aggrenox.  Ready to be discharged home. Dorsiflexion/plantar flexion intact, incision: dressing C/D/I, no cellulitis present and compartment soft.   LABS  Basename    HGB     10.5  HCT     31.4    Discharge Exam: General appearance: alert, cooperative and no distress Extremities: Homans sign is negative, no sign of DVT, no edema, redness or tenderness in the calves or thighs and no ulcers, gangrene or trophic changes  Disposition: Home with follow up in 2 weeks   Follow-up Information    Shelda Pal, MD. Schedule an appointment as soon as possible for a visit in 2 week(s).   Specialty:  Orthopedic Surgery Contact information: [address] [place] [address] [phone]        [facility] .   Why:  home health physical therapy Contact information: [address] [place] [address] [phone]           Discharge Instructions    Call MD / Call 911    Complete by:  As directed   If you experience chest pain or  shortness of breath, CALL 911 and be transported to the hospital emergency room.  If you develope a fever above 101 F, pus (white drainage) or increased drainage or redness at the wound, or calf pain, call your surgeon's office.   Change dressing    Complete by:  As directed   Maintain surgical dressing until follow up in the clinic. If the edges start to pull up, may reinforce with tape. If the dressing is no longer working, may remove and cover with gauze and  tape, but must keep the area dry and clean.  Call with any questions or concerns.   Constipation Prevention    Complete by:  As directed   Drink plenty of fluids.  Prune juice may be helpful.  You may use a stool softener, such as Colace (over the counter) 100 mg twice a day.  Use MiraLax (over the counter) for constipation as needed.   Diet - low sodium heart healthy    Complete by:  As directed   Discharge instructions    Complete by:  As directed   Maintain surgical dressing until follow up in the clinic. If the edges start to pull up, may reinforce with tape. If the dressing is no longer working, may remove and cover with gauze and tape, but must keep the area dry and clean.  Follow up in 2 weeks at [facility]. Call with any questions or concerns.   Increase activity slowly as tolerated    Complete by:  As directed   Weight bearing as tolerated with assist device (walker, cane, etc) as directed, use it as long as suggested by your surgeon or therapist, typically at least 4-6 weeks.   TED hose    Complete by:  As directed   Use stockings (TED hose) for 2 weeks on both leg(s).  You may remove them at night for sleeping.        Medication List    STOP taking these medications   traMADol 50 MG tablet Commonly known as:  ULTRAM     TAKE these medications   amLODipine 2.5 MG tablet Commonly known as:  NORVASC Take 2.5 mg by mouth at bedtime.   atorvastatin 20 MG tablet Commonly known as:  LIPITOR Take 20 mg by mouth at bedtime.   dipyridamole-aspirin 200-25 MG 12hr capsule Commonly known as:  AGGRENOX Take 1 capsule by mouth 2 (two) times daily. Start the day after finishing the Xarelto. What changed:  additional instructions   docusate sodium 100 MG capsule Commonly known as:  COLACE Take 1 capsule (100 mg total) by mouth 2 (two) times daily.   ferrous sulfate 325 (65 FE) MG tablet Take 1 tablet (325 mg total) by mouth 3 (three) times daily after meals.     hydrochlorothiazide 25 MG tablet Commonly known as:  HYDRODIURIL Take 12.5 mg by mouth every morning.   HYDROcodone-acetaminophen 7.5-325 MG tablet Commonly known as:  NORCO Take 1-2 tablets by mouth every 4 (four) hours as needed for moderate pain.   metoprolol succinate 100 MG 24 hr tablet Commonly known as:  TOPROL-XL Take 50-100 mg by mouth 2 (two) times daily. Takes one tablet in the morning ( ) and half a tablet ( ) at night.   polyethylene glycol packet Commonly known as:  MIRALAX / GLYCOLAX Take 17 g by mouth 2 (two) times daily.   potassium chloride 10 MEQ tablet Commonly known as:  K-DUR Take 10 mEq by mouth 2 (two) times daily.   rivaroxaban  10 MG Tabs tablet Commonly known as:  XARELTO Take 1 tablet (10 mg total) by mouth daily. Take for 14 days, then resume Aggrenox.   tiZANidine 4 MG tablet Commonly known as:  ZANAFLEX Take 1 tablet (4 mg total) by mouth every 6 (six) hours as needed for muscle spasms.        Signed: Anastasio AuerbachMatthew S. Tyjae Issa   PA-C  02/14/2016, 3:40 PM

## 2016-12-23 NOTE — H&P (Signed)
TOTAL HIP REVISION ADMISSION H&P  Patient is admitted for right revision total hip arthroplasty, anterior approach.  Subjective:  Chief Complaint:   Failed right THA, femoral subsidence  HPI: Kari Flores, 76 y.o. female, has a history of pain and functional disability in the right hip due to subsidence of the femoral component and patient has failed non-surgical conservative treatments for greater than 12 weeks to include NSAID's and/or analgesics, use of assistive devices and activity modification. The indications for the revision total hip arthroplasty are loosening of one or more components.  Onset of symptoms was gradual starting ~1 years ago with gradually worsening course since that time.  Prior procedures on the right hip include arthroplasty in August 2017 per Dr. Charlann Boxer.  Patient currently rates pain in the right hip at 5 out of 10 with activity.  There is night pain, worsening of pain with activity and weight bearing, trendelenberg gait, pain that interfers with activities of daily living and pain with passive range of motion. Patient has evidence of prosthetic loosening by imaging studies.  This condition presents safety issues increasing the risk of falls.    There is no current active infection.   Risks, benefits and expectations were discussed with the patient.  Risks including but not limited to the risk of anesthesia, blood clots, nerve damage, blood vessel damage, failure of the prosthesis, infection and up to and including death.  Patient understand the risks, benefits and expectations and wishes to proceed with surgery.   PCP: Deloris Ping, MD  D/C Plans:       Home   Post-op Meds:       No Rx given  Tranexamic Acid:      To be given - topically (previous DVT in right LE)  Decadron:      Is to be given  FYI:     Aggrenox  Norco  DME:   Pt already has equipment   PT:   No PT    Patient Active Problem List   Diagnosis Date Noted  . S/P right THA, AA  02/07/2016  . 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 Arthritis   . GERD (gastroesophageal reflux disease)   . H/O blood clots    after knee surgery -1 st surgery right knee-in lower leg  . Hypertension   . Stroke [facility])    no deficits-on Aggrenox- sometime before first knee surgery    Past Surgical History:  Procedure Laterality Date  . CYSTOSCOPY WITH STENT PLACEMENT     years ago  . JOINT REPLACEMENT     right knee  . TONSILLECTOMY    . TOTAL HIP ARTHROPLASTY Right 02/07/2016   Procedure: RIGHT TOTAL HIP ARTHROPLASTY ANTERIOR APPROACH;  Surgeon: Durene Romans, MD;  Location: [facility];  Service: Orthopedics;  Laterality: Right;  . TOTAL KNEE REVISION Right 04/12/2014   Procedure: REVISION RIGHT TOTAL KNEE REPLACEMENT/TIABIAL TRAY VS TOTAL;  Surgeon: Shelda Pal, MD;  Location: [facility];  Service: Orthopedics;  Laterality: Right;  . 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   No Known Allergies   Social History  Substance Use Topics  . Smoking status: Never Smoker  . Smokeless tobacco: Never Used  . Alcohol use Yes     Comment: socially        Review of Systems  Constitutional: Negative.   HENT: Negative.  Eyes: Negative.   Respiratory: Negative.   Cardiovascular: Negative.   Gastrointestinal: Positive for heartburn.  Genitourinary: Negative.   Musculoskeletal: Positive for joint pain.  Skin: Negative.   Neurological: Negative.   Endo/Heme/Allergies: Negative.   Psychiatric/Behavioral: Negative.     Objective:  Physical Exam  Constitutional: She is oriented to person, place, and time. She appears well-developed.  HENT:  Head: Normocephalic.  Eyes: Pupils are equal, round, and reactive to light.  Neck: Neck supple. No JVD present. No tracheal deviation present. No thyromegaly present.  Cardiovascular: Normal rate, regular rhythm and intact distal pulses.   Respiratory: Effort  normal and breath sounds normal. No respiratory distress. She has no wheezes.  GI: Soft. There is no tenderness. There is no guarding.  Musculoskeletal:       Right hip: She exhibits decreased range of motion, decreased strength, tenderness, bony tenderness and laceration (healed previous incision). She exhibits no swelling and no deformity.  Lymphadenopathy:    She has no cervical adenopathy.  Neurological: She is alert and oriented to person, place, and time.  Skin: Skin is warm and dry.  Psychiatric: She has a normal mood and affect.      Labs:  Estimated body mass index is 24.89 kg/m as calculated from the following:   Height as of 02/07/16: 5\' 4"  (1.626 m).   Weight as of 02/07/16: 65.8 kg (145 lb).  Imaging Review:  Plain radiographs demonstrate subsidence femoral component of the right hip(s). There is evidence of loosening of the femoral stem.The bone quality appears to be good for age and reported activity level.   Assessment/Plan:  Right hip with failed previous arthroplasty.  The patient history, physical examination, clinical judgement of the provider and imaging studies are consistent with failure of the right hip(s), previous total hip arthroplasty. Revision total hip arthroplasty is deemed medically necessary. The treatment options including medical management, injection therapy, arthroscopy and arthroplasty were discussed at length. The risks and benefits of total hip arthroplasty were presented and reviewed. The risks due to aseptic loosening, infection, stiffness, dislocation/subluxation,  thromboembolic complications and other imponderables were discussed.  The patient acknowledged the explanation, agreed to proceed with the plan and consent was signed. Patient is being admitted for inpatient treatment for surgery, pain control, PT, OT, prophylactic antibiotics, VTE prophylaxis, progressive ambulation and ADL's and discharge planning. The patient is planning to be  discharged home.      Anastasio AuerbachMatthew S. Mora Pedraza   PA-C  12/23/2016, 10:21 AM

## 2016-12-25 NOTE — Patient Instructions (Signed)
Kari Flores Husband  12/25/2016   Your procedure is scheduled on: 12-31-16   Report to [facility]  Entrance Report to Admitting at 11:15 AM   Call this number if you have problems the morning of surgery [phone]   Remember: ONLY 1 PERSON MAY GO WITH YOU TO SHORT STAY TO GET  READY MORNING OF YOUR SURGERY.  Do not eat food or drink liquids :After Midnight.     Take these medicines the morning of surgery with A SIP OF WATER: Metoprolol                                You may not have any metal on your body including hair pins and              piercings  Do not wear jewelry, make-up, lotions, powders or perfumes, deodorant             Do not wear nail polish.  Do not shave  48 hours prior to surgery.                Do not bring valuables to the hospital. [place] IS NOT             RESPONSIBLE   FOR VALUABLES.  Contacts, dentures or bridgework may not be worn into surgery.  Leave suitcase in the car. After surgery it may be brought to your room.                 Please read over the following fact sheets you were given: _____________________________________________________________________             [facility] - Preparing for Surgery Before surgery, you can play an important role.  Because skin is not sterile, your skin needs to be as free of germs as possible.  You can reduce the number of germs on your skin by washing with CHG (chlorahexidine gluconate) soap before surgery.  CHG is an antiseptic cleaner which kills germs and bonds with the skin to continue killing germs even after washing. Please DO NOT use if you have an allergy to CHG or antibacterial soaps.  If your skin becomes reddened/irritated stop using the CHG and inform your nurse when you arrive at Short Stay. Do not shave (including legs and underarms) for at least 48 hours prior to the first CHG shower.  You may shave your face/neck. Please follow these instructions carefully:  1.   Shower with CHG Soap the night before surgery and the  morning of Surgery.  2.  If you choose to wash your hair, wash your hair first as usual with your  normal  shampoo.  3.  After you shampoo, rinse your hair and body thoroughly to remove the  shampoo.                           4.  Use CHG as you would any other liquid soap.  You can apply chg directly  to the skin and wash                       Gently with a scrungie or clean washcloth.  5.  Apply the CHG Soap to your body ONLY FROM THE NECK DOWN.   Do not use  on face/ open                           Wound or open sores. Avoid contact with eyes, ears mouth and genitals (private parts).                       Wash face,  Genitals (private parts) with your normal soap.             6.  Wash thoroughly, paying special attention to the area where your surgery  will be performed.  7.  Thoroughly rinse your body with warm water from the neck down.  8.  DO NOT shower/wash with your normal soap after using and rinsing off  the CHG Soap.                9.  Pat yourself dry with a clean towel.            10.  Wear clean pajamas.            11.  Place clean sheets on your bed the night of your first shower and do not  sleep with pets. Day of Surgery : Do not apply any lotions/deodorants the morning of surgery.  Please wear clean clothes to the hospital/surgery center.  FAILURE TO FOLLOW THESE INSTRUCTIONS MAY RESULT IN THE CANCELLATION OF YOUR SURGERY PATIENT SIGNATURE_________________________________  NURSE SIGNATURE__________________________________  ________________________________________________________________________   [name]  An incentive spirometer is a tool that can help keep your lungs clear and active. This tool measures how well you are filling your lungs with each breath. Taking long deep breaths may help reverse or decrease the chance of developing breathing (pulmonary) problems (especially infection) following:  A long  period of time when you are unable to move or be active. BEFORE THE PROCEDURE   If the spirometer includes an indicator to show your best effort, your nurse or respiratory therapist will set it to a desired goal.  If possible, sit up straight or lean slightly forward. Try not to slouch.  Hold the incentive spirometer in an upright position. INSTRUCTIONS FOR USE  1. Sit on the edge of your bed if possible, or sit up as far as you can in bed or on a chair. 2. Hold the incentive spirometer in an upright position. 3. Breathe out normally. 4. Place the mouthpiece in your mouth and seal your lips tightly around it. 5. Breathe in slowly and as deeply as possible, raising the piston or the ball toward the top of the column. 6. Hold your breath for 3-5 seconds or for as long as possible. Allow the piston or ball to fall to the bottom of the column. 7. Remove the mouthpiece from your mouth and breathe out normally. 8. Rest for a few seconds and repeat Steps 1 through 7 at least 10 times every 1-2 hours when you are awake. Take your time and take a few normal breaths between deep breaths. 9. The spirometer may include an indicator to show your best effort. Use the indicator as a goal to work toward during each repetition. 10. After each set of 10 deep breaths, practice coughing to be sure your lungs are clear. If you have an incision (the cut made at the time of surgery), support your incision when coughing by placing a pillow or rolled up towels firmly against it. Once you are able to get out of bed, walk around  indoors and cough well. You may stop using the incentive spirometer when instructed by your caregiver.  RISKS AND COMPLICATIONS  Take your time so you do not get dizzy or light-headed.  If you are in pain, you may need to take or ask for pain medication before doing incentive spirometry. It is harder to take a deep breath if you are having pain. AFTER USE  Rest and breathe slowly and  easily.  It can be helpful to keep track of a log of your progress. Your caregiver can provide you with a simple table to help with this. If you are using the spirometer at home, follow these instructions: SEEK MEDICAL CARE IF:   You are having difficultly using the spirometer.  You have trouble using the spirometer as often as instructed.  Your pain medication is not giving enough relief while using the spirometer.  You develop fever of 100.5 F (38.1 C) or higher. SEEK IMMEDIATE MEDICAL CARE IF:   You cough up bloody sputum that had not been present before.  You develop fever of 102 F (38.9 C) or greater.  You develop worsening pain at or near the incision site. MAKE SURE YOU:   Understand these instructions.  Will watch your condition.  Will get help right away if you are not doing well or get worse. Document Released: 11/05/2006 Document Revised: 09/17/2011 Document Reviewed: 01/06/2007 [facility] Patient Information 2014 [employer], [place].   ________________________________________________________________________

## 2016-12-26 ENCOUNTER — Encounter (HOSPITAL_COMMUNITY): Payer: Self-pay

## 2016-12-26 ENCOUNTER — Encounter (HOSPITAL_COMMUNITY)
Admission: RE | Admit: 2016-12-26 | Discharge: 2016-12-26 | Disposition: A | Discharge status: Home / Self Care | Payer: Medicare Other | Source: Ambulatory Visit | Attending: Orthopedic Surgery | Admitting: Orthopedic Surgery

## 2016-12-26 ENCOUNTER — Encounter (INDEPENDENT_AMBULATORY_CARE_PROVIDER_SITE_OTHER): Payer: Self-pay

## 2016-12-26 DIAGNOSIS — Z01812 Encounter for preprocedural laboratory examination: Secondary | ICD-10-CM | POA: Diagnosis present

## 2016-12-26 DIAGNOSIS — Z86718 Personal history of other venous thrombosis and embolism: Secondary | ICD-10-CM | POA: Diagnosis not present

## 2016-12-26 DIAGNOSIS — Z8673 Personal history of transient ischemic attack (TIA), and cerebral infarction without residual deficits: Secondary | ICD-10-CM | POA: Diagnosis not present

## 2016-12-26 DIAGNOSIS — I10 Essential (primary) hypertension: Secondary | ICD-10-CM | POA: Diagnosis not present

## 2016-12-26 DIAGNOSIS — Z96651 Presence of right artificial knee joint: Secondary | ICD-10-CM | POA: Diagnosis not present

## 2016-12-26 DIAGNOSIS — K219 Gastro-esophageal reflux disease without esophagitis: Secondary | ICD-10-CM | POA: Diagnosis not present

## 2016-12-26 NOTE — Patient Instructions (Addendum)
Kari StakesMary N Flores  12/26/2016   Your procedure is scheduled on: 12-31-16  Report [facility]  Entr[facility]  Report to admitting at 1115 AM  Call this number if you have problems the morning of surgery [phone]   Remember: ONLY 1 PERSON MAY GO WITH YOU TO SHORT STAY TO GET  READY MORNING OF YOUR SURGERY.  Do not eat food :After Midnight. Clear liquids from midnight until 745 am, nothing after 745 am day of surgery.      Take these medicines the morning of surgery with A SIP OF WATER: metoprolol                               You may not have any metal on your body including hair pins and              piercings  Do not wear jewelry, make-up, lotions, powders or perfumes, deodorant             Do not wear nail polish.  Do not shave  48 hours prior to surgery.              Men may shave face and neck.   Do not bring valuables to the hospital. [place] IS NOT             RESPONSIBLE   FOR VALUABLES.  Contacts, dentures or bridgework may not be worn into surgery.  Leave suitcase in the car. After surgery it may be brought to your room.                  Please read over the following fact sheets you were given: _____________________________________________________________________                CLEAR LIQUID DIET   Foods Allowed                                                                     Foods Excluded  Coffee and tea, regular and decaf                             liquids that you cannot  Plain Jell-O in any flavor                                             see through such as: Fruit ices (not with fruit pulp)                                     milk, soups, orange juice  Iced Popsicles                                    All solid food Carbonated beverages, regular and diet  Cranberry, grape and apple juices Sports drinks like Gatorade Lightly seasoned clear broth or consume(fat free) Sugar, honey  syrup  Sample Menu Breakfast                                Lunch                                     Supper Cranberry juice                    Beef broth                            Chicken broth Jell-O                                     Grape juice                           Apple juice Coffee or tea                        Jell-O                                      Popsicle                                                Coffee or tea                        Coffee or tea  _____________________________________________________________________  [facility] - Preparing for Surgery Before surgery, you can play an important role.  Because skin is not sterile, your skin needs to be as free of germs as possible.  You can reduce the number of germs on your skin by washing with CHG (chlorahexidine gluconate) soap before surgery.  CHG is an antiseptic cleaner which kills germs and bonds with the skin to continue killing germs even after washing. Please DO NOT use if you have an allergy to CHG or antibacterial soaps.  If your skin becomes reddened/irritated stop using the CHG and inform your nurse when you arrive at Short Stay. Do not shave (including legs and underarms) for at least 48 hours prior to the first CHG shower.  You may shave your face/neck. Please follow these instructions carefully:  1.  Shower with CHG Soap the night before surgery and the  morning of Surgery.  2.  If you choose to wash your hair, wash your hair first as usual with your  normal  shampoo.  3.  After you shampoo, rinse your hair and body thoroughly to remove the  shampoo.                           4.  Use CHG as you would any other liquid soap.  You can apply chg directly  to the skin and wash  Gently with a scrungie or clean washcloth.  5.  Apply the CHG Soap to your body ONLY FROM THE NECK DOWN.   Do not use on face/ open                           Wound or open sores. Avoid contact with eyes, ears mouth  and genitals (private parts).                       Wash face,  Genitals (private parts) with your normal soap.             6.  Wash thoroughly, paying special attention to the area where your surgery  will be performed.  7.  Thoroughly rinse your body with warm water from the neck down.  8.  DO NOT shower/wash with your normal soap after using and rinsing off  the CHG Soap.                9.  Pat yourself dry with a clean towel.            10.  Wear clean pajamas.            11.  Place clean sheets on your bed the night of your first shower and do not  sleep with pets. Day of Surgery : Do not apply any lotions/deodorants the morning of surgery.  Please wear clean clothes to the hospital/surgery center.  FAILURE TO FOLLOW THESE INSTRUCTIONS MAY RESULT IN THE CANCELLATION OF YOUR SURGERY PATIENT SIGNATURE_________________________________  NURSE SIGNATURE__________________________________  ________________________________________________________________________   [name]  An incentive spirometer is a tool that can help keep your lungs clear and active. This tool measures how well you are filling your lungs with each breath. Taking long deep breaths may help reverse or decrease the chance of developing breathing (pulmonary) problems (especially infection) following:  A long period of time when you are unable to move or be active. BEFORE THE PROCEDURE   If the spirometer includes an indicator to show your best effort, your nurse or respiratory therapist will set it to a desired goal.  If possible, sit up straight or lean slightly forward. Try not to slouch.  Hold the incentive spirometer in an upright position. INSTRUCTIONS FOR USE  1. Sit on the edge of your bed if possible, or sit up as far as you can in bed or on a chair. 2. Hold the incentive spirometer in an upright position. 3. Breathe out normally. 4. Place the mouthpiece in your mouth and seal your lips tightly  around it. 5. Breathe in slowly and as deeply as possible, raising the piston or the ball toward the top of the column. 6. Hold your breath for 3-5 seconds or for as long as possible. Allow the piston or ball to fall to the bottom of the column. 7. Remove the mouthpiece from your mouth and breathe out normally. 8. Rest for a few seconds and repeat Steps 1 through 7 at least 10 times every 1-2 hours when you are awake. Take your time and take a few normal breaths between deep breaths. 9. The spirometer may include an indicator to show your best effort. Use the indicator as a goal to work toward during each repetition. 10. After each set of 10 deep breaths, practice coughing to be sure your lungs are clear. If you have an incision (the cut made at the time of  surgery), support your incision when coughing by placing a pillow or rolled up towels firmly against it. Once you are able to get out of bed, walk around indoors and cough well. You may stop using the incentive spirometer when instructed by your caregiver.  RISKS AND COMPLICATIONS  Take your time so you do not get dizzy or light-headed.  If you are in pain, you may need to take or ask for pain medication before doing incentive spirometry. It is harder to take a deep breath if you are having pain. AFTER USE  Rest and breathe slowly and easily.  It can be helpful to keep track of a log of your progress. Your caregiver can provide you with a simple table to help with this. If you are using the spirometer at home, follow these instructions: Horace IF:   You are having difficultly using the spirometer.  You have trouble using the spirometer as often as instructed.  Your pain medication is not giving enough relief while using the spirometer.  You develop fever of 100.5 F (38.1 C) or higher. SEEK IMMEDIATE MEDICAL CARE IF:   You cough up bloody sputum that had not been present before.  You develop fever of 102 F (38.9 C) or  greater.  You develop worsening pain at or near the incision site. MAKE SURE YOU:   Understand these instructions.  Will watch your condition.  Will get help right away if you are not doing well or get worse. Document Released: 11/05/2006 Document Revised: 09/17/2011 Document Reviewed: 01/06/2007 Exit[employer] Patient Information 2014 Exit[employer], [place].   ________________________________________________________________________  WHAT IS A BLOOD TRANSFUSION? Blood Transfusion Information  A transfusion is the replacement of blood or some of its parts. Blood is made up of multiple cells which provide different functions.  Red blood cells carry oxygen and are used for blood loss replacement.  White blood cells fight against infection.  Platelets control bleeding.  Plasma helps clot blood.  Other blood products are available for specialized needs, such as hemophilia or other clotting disorders. BEFORE THE TRANSFUSION  Who gives blood for transfusions?   Healthy volunteers who are fully evaluated to make sure their blood is safe. This is blood bank blood. Transfusion therapy is the safest it has ever been in the practice of medicine. Before blood is taken from a donor, a complete history is taken to make sure that person has no history of diseases nor engages in risky social behavior (examples are intravenous drug use or sexual activity with multiple partners). The donor's travel history is screened to minimize risk of transmitting infections, such as malaria. The donated blood is tested for signs of infectious diseases, such as HIV and hepatitis. The blood is then tested to be sure it is compatible with you in order to minimize the chance of a transfusion reaction. If you or a relative donates blood, this is often done in anticipation of surgery and is not appropriate for emergency situations. It takes many days to process the donated blood. RISKS AND COMPLICATIONS Although transfusion therapy  is very safe and saves many lives, the main dangers of transfusion include:   Getting an infectious disease.  Developing a transfusion reaction. This is an allergic reaction to something in the blood you were given. Every precaution is taken to prevent this. The decision to have a blood transfusion has been considered carefully by your caregiver before blood is given. Blood is not given unless the benefits outweigh the risks. AFTER THE  TRANSFUSION  Right after receiving a blood transfusion, you will usually feel much better and more energetic. This is especially true if your red blood cells have gotten low (anemic). The transfusion raises the level of the red blood cells which carry oxygen, and this usually causes an energy increase.  The nurse administering the transfusion will monitor you carefully for complications. HOME CARE INSTRUCTIONS  No special instructions are needed after a transfusion. You may find your energy is better. Speak with your caregiver about any limitations on activity for underlying diseases you may have. SEEK MEDICAL CARE IF:   Your condition is not improving after your transfusion.  You develop redness or irritation at the intravenous (IV) site. SEEK IMMEDIATE MEDICAL CARE IF:  Any of the following symptoms occur over the next 12 hours:  Shaking chills.  You have a temperature by mouth above 102 F (38.9 C), not controlled by medicine.  Chest, back, or muscle pain.  People around you feel you are not acting correctly or are confused.  Shortness of breath or difficulty breathing.  Dizziness and fainting.  You get a rash or develop hives.  You have a decrease in urine output.  Your urine turns a dark color or changes to pink, red, or brown. Any of the following symptoms occur over the next 10 days:  You have a temperature by mouth above 102 F (38.9 C), not controlled by medicine.  Shortness of breath.  Weakness after normal activity.  The white  part of the eye turns yellow (jaundice).  You have a decrease in the amount of urine or are urinating less often.  Your urine turns a dark color or changes to pink, red, or brown. Document Released: 06/22/2000 Document Revised: 09/17/2011 Document Reviewed: 02/09/2008 [facility] Patient Information 2014 [place], [place].  _______________________________________________________________________

## 2016-12-26 NOTE — Progress Notes (Signed)
Medical clearnce note dr ryter brown on chart Labs drawn 12-24-16 from dr ryter-brown on chart:cbc with dif cmet , ua, pcr screen,  ekg 12-24-16 dr Turner Danielsryter-brown on chart

## 2016-12-31 ENCOUNTER — Inpatient Hospital Stay (HOSPITAL_COMMUNITY): Payer: Medicare Other

## 2016-12-31 ENCOUNTER — Inpatient Hospital Stay (HOSPITAL_COMMUNITY)
Admission: RE | Admit: 2016-12-31 | Discharge: 2017-01-01 | DRG: 468 | Disposition: A | Discharge status: Home / Self Care | Payer: Medicare Other | Source: Ambulatory Visit | Attending: Orthopedic Surgery | Admitting: Orthopedic Surgery

## 2016-12-31 ENCOUNTER — Inpatient Hospital Stay (HOSPITAL_COMMUNITY): Payer: Medicare Other | Admitting: Anesthesiology

## 2016-12-31 ENCOUNTER — Encounter (HOSPITAL_COMMUNITY): Payer: Self-pay | Admitting: Anesthesiology

## 2016-12-31 ENCOUNTER — Encounter (HOSPITAL_COMMUNITY)
Admission: RE | Disposition: A | Discharge status: Home / Self Care | Payer: Self-pay | Source: Ambulatory Visit | Attending: Orthopedic Surgery

## 2016-12-31 DIAGNOSIS — Z86718 Personal history of other venous thrombosis and embolism: Secondary | ICD-10-CM

## 2016-12-31 DIAGNOSIS — T84030A Mechanical loosening of internal right hip prosthetic joint, initial encounter: Principal | ICD-10-CM | POA: Diagnosis present

## 2016-12-31 DIAGNOSIS — I10 Essential (primary) hypertension: Secondary | ICD-10-CM | POA: Diagnosis present

## 2016-12-31 DIAGNOSIS — M1990 Unspecified osteoarthritis, unspecified site: Secondary | ICD-10-CM | POA: Diagnosis present

## 2016-12-31 DIAGNOSIS — Z8673 Personal history of transient ischemic attack (TIA), and cerebral infarction without residual deficits: Secondary | ICD-10-CM | POA: Diagnosis not present

## 2016-12-31 DIAGNOSIS — Y792 Prosthetic and other implants, materials and accessory orthopedic devices associated with adverse incidents: Secondary | ICD-10-CM | POA: Diagnosis present

## 2016-12-31 DIAGNOSIS — Z96651 Presence of right artificial knee joint: Secondary | ICD-10-CM | POA: Diagnosis present

## 2016-12-31 DIAGNOSIS — K219 Gastro-esophageal reflux disease without esophagitis: Secondary | ICD-10-CM | POA: Diagnosis present

## 2016-12-31 DIAGNOSIS — Z96649 Presence of unspecified artificial hip joint: Secondary | ICD-10-CM

## 2016-12-31 DIAGNOSIS — M25551 Pain in right hip: Secondary | ICD-10-CM | POA: Diagnosis present

## 2016-12-31 HISTORY — PX: ANTERIOR HIP REVISION: SHX6527

## 2016-12-31 LAB — PREPARE RBC (CROSSMATCH)

## 2016-12-31 SURGERY — REVISION, TOTAL ARTHROPLASTY, HIP, ANTERIOR APPROACH
Anesthesia: Spinal | Site: Hip | Laterality: Right

## 2016-12-31 MED ORDER — FENTANYL CITRATE (PF) 100 MCG/2ML IJ SOLN
25.0000 ug | INTRAMUSCULAR | Status: DC | PRN
  Administered 2016-12-31 (×4): 50 ug via INTRAVENOUS

## 2016-12-31 MED ORDER — DOCUSATE SODIUM 100 MG PO CAPS: 100.0000 mg | ORAL_CAPSULE | Freq: Two times a day (BID) | ORAL | 0 refills | Status: DC

## 2016-12-31 MED ORDER — POTASSIUM CHLORIDE ER 10 MEQ PO TBCR
10.0000 meq | EXTENDED_RELEASE_TABLET | Freq: Two times a day (BID) | ORAL | Status: DC
  Administered 2016-12-31 – 2017-01-01 (×2): 10 meq via ORAL
  Filled 2016-12-31 (×4): qty 1

## 2016-12-31 MED ORDER — BUPIVACAINE HCL (PF) 0.5 % IJ SOLN
INTRAMUSCULAR | Status: DC | PRN
  Administered 2016-12-31: 3 mL

## 2016-12-31 MED ORDER — PHENYLEPHRINE HCL 10 MG/ML IJ SOLN
INTRAVENOUS | Status: DC | PRN
  Administered 2016-12-31: 15 ug/min via INTRAVENOUS

## 2016-12-31 MED ORDER — METHOCARBAMOL 500 MG PO TABS: 500.0000 mg | ORAL_TABLET | Freq: Four times a day (QID) | ORAL | 0 refills | Status: DC | PRN

## 2016-12-31 MED ORDER — MENTHOL 3 MG MT LOZG: 1.0000 | LOZENGE | OROMUCOSAL | Status: DC | PRN

## 2016-12-31 MED ORDER — PROPOFOL 10 MG/ML IV BOLUS
INTRAVENOUS | Status: DC | PRN
  Administered 2016-12-31: 20 mg via INTRAVENOUS

## 2016-12-31 MED ORDER — SUCCINYLCHOLINE CHLORIDE 200 MG/10ML IV SOSY
PREFILLED_SYRINGE | INTRAVENOUS | Status: AC
  Filled 2016-12-31: qty 10

## 2016-12-31 MED ORDER — FENTANYL CITRATE (PF) 100 MCG/2ML IJ SOLN
INTRAMUSCULAR | Status: DC | PRN
  Administered 2016-12-31 (×4): 25 ug via INTRAVENOUS

## 2016-12-31 MED ORDER — ONDANSETRON HCL 4 MG/2ML IJ SOLN: 4.0000 mg | Freq: Four times a day (QID) | INTRAMUSCULAR | Status: DC | PRN

## 2016-12-31 MED ORDER — DOCUSATE SODIUM 100 MG PO CAPS
100.0000 mg | ORAL_CAPSULE | Freq: Two times a day (BID) | ORAL | Status: DC
  Administered 2016-12-31 – 2017-01-01 (×2): 100 mg via ORAL
  Filled 2016-12-31 (×2): qty 1

## 2016-12-31 MED ORDER — CELECOXIB 200 MG PO CAPS
200.0000 mg | ORAL_CAPSULE | Freq: Two times a day (BID) | ORAL | Status: DC
  Administered 2016-12-31 – 2017-01-01 (×2): 200 mg via ORAL
  Filled 2016-12-31 (×2): qty 1

## 2016-12-31 MED ORDER — SODIUM CHLORIDE 0.9 % IR SOLN
Status: DC | PRN
  Administered 2016-12-31: 2000 mL

## 2016-12-31 MED ORDER — PROPOFOL 10 MG/ML IV BOLUS
INTRAVENOUS | Status: AC
  Filled 2016-12-31: qty 20

## 2016-12-31 MED ORDER — ATORVASTATIN CALCIUM 20 MG PO TABS
20.0000 mg | ORAL_TABLET | Freq: Every day | ORAL | Status: DC
  Administered 2016-12-31: 22:00:00 20 mg via ORAL
  Filled 2016-12-31: qty 1

## 2016-12-31 MED ORDER — POLYETHYLENE GLYCOL 3350 17 G PO PACK
17.0000 g | PACK | Freq: Two times a day (BID) | ORAL | Status: DC
  Administered 2016-12-31 – 2017-01-01 (×2): 17 g via ORAL
  Filled 2016-12-31 (×2): qty 1

## 2016-12-31 MED ORDER — PROPOFOL 10 MG/ML IV BOLUS
INTRAVENOUS | Status: AC
Start: 2016-12-31 — End: 2016-12-31
  Filled 2016-12-31: qty 20

## 2016-12-31 MED ORDER — HYDROCODONE-ACETAMINOPHEN 7.5-325 MG PO TABS
1.0000 | ORAL_TABLET | ORAL | Status: DC
Start: 2016-12-31 — End: 2017-01-01
  Administered 2016-12-31 – 2017-01-01 (×4): 1 via ORAL
  Filled 2016-12-31 (×2): qty 1
  Filled 2016-12-31: qty 2
  Filled 2016-12-31 (×2): qty 1

## 2016-12-31 MED ORDER — AMLODIPINE BESYLATE 5 MG PO TABS: 2.5000 mg | ORAL_TABLET | Freq: Every day | ORAL | Status: DC

## 2016-12-31 MED ORDER — ALUM & MAG HYDROXIDE-SIMETH 200-200-20 MG/5ML PO SUSP: 15.0000 mL | ORAL | Status: DC | PRN

## 2016-12-31 MED ORDER — METOCLOPRAMIDE HCL 5 MG/ML IJ SOLN: 5.0000 mg | Freq: Three times a day (TID) | INTRAMUSCULAR | Status: DC | PRN

## 2016-12-31 MED ORDER — DIPHENHYDRAMINE HCL 25 MG PO CAPS: 25.0000 mg | ORAL_CAPSULE | Freq: Four times a day (QID) | ORAL | Status: DC | PRN

## 2016-12-31 MED ORDER — CEFAZOLIN SODIUM-DEXTROSE 1-4 GM/50ML-% IV SOLN
1.0000 g | Freq: Four times a day (QID) | INTRAVENOUS | Status: AC
  Administered 2016-12-31 – 2017-01-01 (×2): 1 g via INTRAVENOUS
  Filled 2016-12-31 (×2): qty 50

## 2016-12-31 MED ORDER — LACTATED RINGERS IV SOLN
INTRAVENOUS | Status: DC | PRN
  Administered 2016-12-31 (×2): via INTRAVENOUS

## 2016-12-31 MED ORDER — FERROUS SULFATE 325 (65 FE) MG PO TABS
325.0000 mg | ORAL_TABLET | Freq: Three times a day (TID) | ORAL | Status: DC
  Administered 2016-12-31 – 2017-01-01 (×3): 325 mg via ORAL
  Filled 2016-12-31 (×3): qty 1

## 2016-12-31 MED ORDER — DEXAMETHASONE SODIUM PHOSPHATE 10 MG/ML IJ SOLN: 10.0000 mg | Freq: Once | INTRAMUSCULAR | Status: DC

## 2016-12-31 MED ORDER — HYDROCODONE-ACETAMINOPHEN 7.5-325 MG PO TABS: 1.0000 | ORAL_TABLET | ORAL | 0 refills | Status: DC | PRN

## 2016-12-31 MED ORDER — LIDOCAINE 2% (20 MG/ML) 5 ML SYRINGE
INTRAMUSCULAR | Status: AC
  Filled 2016-12-31: qty 5

## 2016-12-31 MED ORDER — METOPROLOL SUCCINATE ER 50 MG PO TB24
50.0000 mg | ORAL_TABLET | Freq: Two times a day (BID) | ORAL | Status: DC
  Filled 2016-12-31: qty 1

## 2016-12-31 MED ORDER — ALBUMIN HUMAN 5 % IV SOLN
INTRAVENOUS | Status: AC
  Filled 2016-12-31: qty 500

## 2016-12-31 MED ORDER — POLYETHYLENE GLYCOL 3350 17 G PO PACK: 17.0000 g | PACK | Freq: Two times a day (BID) | ORAL | 0 refills | Status: DC

## 2016-12-31 MED ORDER — FENTANYL CITRATE (PF) 100 MCG/2ML IJ SOLN
INTRAMUSCULAR | Status: AC
  Filled 2016-12-31: qty 2

## 2016-12-31 MED ORDER — BISACODYL 10 MG RE SUPP: 10.0000 mg | Freq: Every day | RECTAL | Status: DC | PRN

## 2016-12-31 MED ORDER — CEFAZOLIN SODIUM-DEXTROSE 2-4 GM/100ML-% IV SOLN
2.0000 g | INTRAVENOUS | Status: AC
  Administered 2016-12-31: 2 g via INTRAVENOUS

## 2016-12-31 MED ORDER — TRANEXAMIC ACID 1000 MG/10ML IV SOLN
INTRAVENOUS | Status: AC | PRN
  Administered 2016-12-31: 2000 mg via TOPICAL

## 2016-12-31 MED ORDER — METHOCARBAMOL 500 MG PO TABS
500.0000 mg | ORAL_TABLET | Freq: Four times a day (QID) | ORAL | Status: DC | PRN
  Administered 2016-12-31: 500 mg via ORAL
  Filled 2016-12-31: qty 1

## 2016-12-31 MED ORDER — ONDANSETRON HCL 4 MG/2ML IJ SOLN
INTRAMUSCULAR | Status: AC
Start: 2016-12-31 — End: 2016-12-31
  Filled 2016-12-31: qty 4

## 2016-12-31 MED ORDER — ASPIRIN-DIPYRIDAMOLE ER 25-200 MG PO CP12
1.0000 | ORAL_CAPSULE | Freq: Two times a day (BID) | ORAL | Status: DC
  Administered 2017-01-01: 09:00:00 1 via ORAL
  Filled 2016-12-31 (×2): qty 1

## 2016-12-31 MED ORDER — HYDROCHLOROTHIAZIDE 25 MG PO TABS
12.5000 mg | ORAL_TABLET | Freq: Every day | ORAL | Status: DC
  Administered 2017-01-01: 09:00:00 12.5 mg via ORAL
  Filled 2016-12-31: qty 1

## 2016-12-31 MED ORDER — PROPOFOL 500 MG/50ML IV EMUL
INTRAVENOUS | Status: DC | PRN
  Administered 2016-12-31: 75 ug/kg/min via INTRAVENOUS

## 2016-12-31 MED ORDER — METHOCARBAMOL 1000 MG/10ML IJ SOLN
500.0000 mg | Freq: Four times a day (QID) | INTRAVENOUS | Status: DC | PRN
  Administered 2016-12-31: 500 mg via INTRAVENOUS
  Filled 2016-12-31: qty 550

## 2016-12-31 MED ORDER — METOCLOPRAMIDE HCL 5 MG PO TABS: 5.0000 mg | ORAL_TABLET | Freq: Three times a day (TID) | ORAL | Status: DC | PRN

## 2016-12-31 MED ORDER — PROPOFOL 10 MG/ML IV BOLUS
INTRAVENOUS | Status: AC
  Filled 2016-12-31: qty 40

## 2016-12-31 MED ORDER — DEXAMETHASONE SODIUM PHOSPHATE 10 MG/ML IJ SOLN
INTRAMUSCULAR | Status: AC
  Filled 2016-12-31: qty 1

## 2016-12-31 MED ORDER — SODIUM CHLORIDE 0.9 % IV SOLN
2000.0000 mg | Freq: Once | INTRAVENOUS | Status: DC
  Filled 2016-12-31: qty 20

## 2016-12-31 MED ORDER — PHENYLEPHRINE HCL 10 MG/ML IJ SOLN
INTRAMUSCULAR | Status: AC
  Filled 2016-12-31: qty 1

## 2016-12-31 MED ORDER — ONDANSETRON HCL 4 MG PO TABS: 4.0000 mg | ORAL_TABLET | Freq: Four times a day (QID) | ORAL | Status: DC | PRN

## 2016-12-31 MED ORDER — CEFAZOLIN SODIUM-DEXTROSE 2-4 GM/100ML-% IV SOLN
INTRAVENOUS | Status: AC
  Filled 2016-12-31: qty 100

## 2016-12-31 MED ORDER — PHENOL 1.4 % MT LIQD
1.0000 | OROMUCOSAL | Status: DC | PRN
  Filled 2016-12-31: qty 177

## 2016-12-31 MED ORDER — SODIUM CHLORIDE 0.9 % IV SOLN: Freq: Once | INTRAVENOUS | Status: DC

## 2016-12-31 MED ORDER — FENTANYL CITRATE (PF) 100 MCG/2ML IJ SOLN
INTRAMUSCULAR | Status: AC
  Filled 2016-12-31: qty 4

## 2016-12-31 MED ORDER — ALBUMIN HUMAN 5 % IV SOLN
INTRAVENOUS | Status: DC | PRN
  Administered 2016-12-31 (×2): via INTRAVENOUS

## 2016-12-31 MED ORDER — MAGNESIUM CITRATE PO SOLN: 1.0000 | Freq: Once | ORAL | Status: DC | PRN

## 2016-12-31 MED ORDER — FENTANYL CITRATE (PF) 100 MCG/2ML IJ SOLN: 25.0000 ug | INTRAMUSCULAR | Status: DC | PRN

## 2016-12-31 MED ORDER — DEXAMETHASONE SODIUM PHOSPHATE 10 MG/ML IJ SOLN
10.0000 mg | Freq: Once | INTRAMUSCULAR | Status: AC
  Administered 2016-12-31: 10 mg via INTRAVENOUS

## 2016-12-31 MED ORDER — SODIUM CHLORIDE 0.9 % IV SOLN
INTRAVENOUS | Status: DC
  Administered 2017-01-01: 01:00:00 via INTRAVENOUS

## 2016-12-31 MED ORDER — ONDANSETRON HCL 4 MG/2ML IJ SOLN
INTRAMUSCULAR | Status: DC | PRN
  Administered 2016-12-31: 4 mg via INTRAVENOUS

## 2016-12-31 MED ORDER — STERILE WATER FOR IRRIGATION IR SOLN
Status: DC | PRN
  Administered 2016-12-31: 2000 mL

## 2016-12-31 SURGICAL SUPPLY — 38 items
ADH SKN CLS APL DERMABOND .7 (GAUZE/BANDAGES/DRESSINGS) ×1
BAG SPEC THK2 15X12 ZIP CLS (MISCELLANEOUS) ×1
BAG ZIPLOCK 12X15 (MISCELLANEOUS) ×2 IMPLANT
CAPT HIP TOTAL 2 ×2 IMPLANT
CLOTH BEACON ORANGE TIMEOUT ST (SAFETY) ×3 IMPLANT
COVER PERINEAL POST (MISCELLANEOUS) ×3 IMPLANT
COVER SURGICAL LIGHT HANDLE (MISCELLANEOUS) ×3 IMPLANT
DERMABOND ADVANCED (GAUZE/BANDAGES/DRESSINGS) ×2
DERMABOND ADVANCED .7 DNX12 (GAUZE/BANDAGES/DRESSINGS) ×1 IMPLANT
DRAPE STERI IOBAN 125X83 (DRAPES) ×3 IMPLANT
DRAPE U-SHAPE 47X51 STRL (DRAPES) ×6 IMPLANT
DRESSING AQUACEL AG SP 3.5X10 (GAUZE/BANDAGES/DRESSINGS) IMPLANT
DRSG AQUACEL AG ADV 3.5X10 (GAUZE/BANDAGES/DRESSINGS) ×3 IMPLANT
DRSG AQUACEL AG SP 3.5X10 (GAUZE/BANDAGES/DRESSINGS) ×3
DURAPREP 26ML APPLICATOR (WOUND CARE) ×3 IMPLANT
ELECT REM PT RETURN 15FT ADLT (MISCELLANEOUS) ×3 IMPLANT
ELIMINATOR HOLE APEX DEPUY (Hips) IMPLANT
GLOVE BIOGEL PI IND STRL 7.5 (GLOVE) ×1 IMPLANT
GLOVE BIOGEL PI IND STRL 8.5 (GLOVE) ×1 IMPLANT
GLOVE BIOGEL PI INDICATOR 7.5 (GLOVE) ×10
GLOVE BIOGEL PI INDICATOR 8.5 (GLOVE) ×2
GLOVE ECLIPSE 8.0 STRL XLNG CF (GLOVE) ×5 IMPLANT
GLOVE ORTHO TXT STRL SZ7.5 (GLOVE) ×4 IMPLANT
GLOVE SS BIOGEL STRL SZ 8 (GLOVE) IMPLANT
GLOVE SUPERSENSE BIOGEL SZ 8 (GLOVE) ×4
GLOVE SURG SS PI 7.5 STRL IVOR (GLOVE) ×2 IMPLANT
GOWN STRL REUS W/TWL 2XL LVL3 (GOWN DISPOSABLE) ×2 IMPLANT
GOWN STRL REUS W/TWL LRG LVL3 (GOWN DISPOSABLE) ×3 IMPLANT
GOWN STRL REUS W/TWL XL LVL3 (GOWN DISPOSABLE) ×5 IMPLANT
HOLDER FOLEY CATH W/STRAP (MISCELLANEOUS) ×3 IMPLANT
PACK ANTERIOR HIP CUSTOM (KITS) ×3 IMPLANT
SAW OSC TIP CART 19.5X105X1.3 (SAW) ×3 IMPLANT
SUT MNCRL AB 4-0 PS2 18 (SUTURE) ×3 IMPLANT
SUT VIC AB 1 CT1 36 (SUTURE) ×9 IMPLANT
SUT VIC AB 2-0 CT1 27 (SUTURE) ×6
SUT VIC AB 2-0 CT1 TAPERPNT 27 (SUTURE) ×2 IMPLANT
SUT VLOC 180 0 24IN GS25 (SUTURE) ×3 IMPLANT
TRAY FOLEY W/METER SILVER 16FR (SET/KITS/TRAYS/PACK) ×3 IMPLANT

## 2016-12-31 NOTE — Interval H&P Note (Signed)
History and Physical Interval Note:  12/31/2016 1:02 PM  Kari Flores  has presented today for surgery, with the diagnosis of Failed right total hip replacement  The various methods of treatment have been discussed with the patient and family. After consideration of risks, benefits and other options for treatment, the patient has consented to  Procedure(s) with comments: RIGHT ANTERIOR HIP REVISION (Right) - 120 mins as a surgical intervention .  The patient's history has been reviewed, patient examined, no change in status, stable for surgery.  I have reviewed the patient's chart and labs.  Questions were answered to the patient's satisfaction.     Shelda PalLIN,Baer Hinton D

## 2016-12-31 NOTE — Anesthesia Procedure Notes (Addendum)
Spinal  Patient location during procedure: OR Preanesthetic Checklist Completed: patient identified, site marked, surgical consent, pre-op evaluation, timeout performed, IV checked, risks and benefits discussed and monitors and equipment checked Spinal Block Patient position: sitting Prep: DuraPrep Patient monitoring: heart rate, cardiac monitor, continuous pulse ox and blood pressure Approach: midline Location: L3-4 Injection technique: single-shot Needle Needle type: Sprotte  Needle gauge: 24 G Needle length: 9 cm Assessment Sensory level: T4 Additional Notes Spinal Dosage in OR  .5% Bupivicaine ml       3.0

## 2016-12-31 NOTE — Discharge Instructions (Signed)

## 2016-12-31 NOTE — Anesthesia Preprocedure Evaluation (Signed)
Anesthesia Evaluation  Patient identified by MRN, date of birth, ID band Patient awake    Reviewed: Allergy & Precautions, H&P , Patient's Chart, lab work & pertinent test results, reviewed documented beta blocker date and time   Airway Mallampati: II  TM Distance: >3 FB Neck ROM: full    Dental no notable dental hx. 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   Dental Advisory Given  Plan Discussed with: CRNA and Surgeon  Anesthesia Plan Comments: (  )        Anesthesia Quick Evaluation

## 2016-12-31 NOTE — Anesthesia Postprocedure Evaluation (Signed)
Anesthesia Post Note  Patient: Kari Flores  Procedure(s) Performed: Procedure(s) (LRB): RIGHT ANTERIOR HIP REVISION (Right)     Patient location during evaluation: PACU Anesthesia Type: Spinal Level of consciousness: awake Pain management: satisfactory to patient Vital Signs Assessment: post-procedure vital signs reviewed and stable Respiratory status: spontaneous breathing Cardiovascular status: blood pressure returned to baseline Postop Assessment: no headache and spinal receding Anesthetic complications: no    Last Vitals:  Vitals:   12/31/16 1807 12/31/16 1815  BP: (!) 90/57 (!) 90/57  Pulse:  (!) 50  Resp: 14 14  Temp: 36.4 C 36.4 C    Last Pain:  Vitals:   12/31/16 1815  TempSrc: Oral  PainSc: 3                  Chanler Mendonca EDWARD

## 2016-12-31 NOTE — Brief Op Note (Signed)
12/31/2016  4:02 PM  PATIENT:  Kari Flores  76 y.o. female  PRE-OPERATIVE DIAGNOSIS:  Failed right total hip replacement  POST-OPERATIVE DIAGNOSIS:  Failed right total hip replacement  PROCEDURE:  Procedure(s) with comments: RIGHT ANTERIOR HIP REVISION (Right) - 120 mins  SURGEON:  Surgeon(s) and Role:    Durene Romans* Yamil Oelke, MD - Primary  PHYSICIAN ASSISTANT: Lanney GinsMatthew Babish, PA-C  ANESTHESIA:   spinal  EBL:  Total I/O In: 1000 [I.V.:1000] Out: 925 [Urine:325; Blood:600]  BLOOD ADMINISTERED:none  DRAINS: none   LOCAL MEDICATIONS USED:  NONE  SPECIMEN:  No Specimen  DISPOSITION OF SPECIMEN:  N/A  COUNTS:  YES  TOURNIQUET:  * No tourniquets in log *  DICTATION: .Other Dictation: Dictation Number [id number]  PLAN OF CARE: Admit to inpatient   PATIENT DISPOSITION:  PACU - hemodynamically stable.   Delay start of Pharmacological VTE agent (>24hrs) due to surgical blood loss or risk of bleeding: no

## 2016-12-31 NOTE — Transfer of Care (Signed)
Immediate Anesthesia Transfer of Care Note  Patient: Kari Flores  Procedure(s) Performed: Procedure(s) with comments: RIGHT ANTERIOR HIP REVISION (Right) - 120 mins  Patient Location: PACU  Anesthesia Type:Spinal  Level of Consciousness: awake and alert   Airway & Oxygen Therapy: Patient Spontanous Breathing and Patient connected to face mask oxygen  Post-op Assessment: Report given to RN and Post -op Vital signs reviewed and stable  Post vital signs: Reviewed and stable  Last Vitals:  Vitals:   12/31/16 1122 12/31/16 1134  BP: (!) 190/69 (!) 172/69  Pulse: 66   Resp: 18   Temp: 36.5 C     Last Pain:  Vitals:   12/31/16 1122  TempSrc: Oral      Patients Stated Pain Goal: 4 (12/31/16 1232)  Complications: No apparent anesthesia complications

## 2017-01-01 LAB — BASIC METABOLIC PANEL
Anion gap: 5 (ref 5–15)
BUN: 12 mg/dL (ref 6–20)
CHLORIDE: 108 mmol/L (ref 101–111)
CO2: 26 mmol/L (ref 22–32)
Calcium: 8.4 mg/dL — ABNORMAL LOW (ref 8.9–10.3)
Creatinine, Ser: 0.72 mg/dL (ref 0.44–1.00)
GFR calc Af Amer: >60 mL/min (ref >60)
GFR calc non Af Amer: >60 mL/min (ref >60)
GLUCOSE: 148 mg/dL — AB (ref 65–99)
POTASSIUM: 3.9 mmol/L (ref 3.5–5.1)
SODIUM: 139 mmol/L (ref 135–145)

## 2017-01-01 LAB — CBC
HEMATOCRIT: 25.8 % — AB (ref 36.0–46.0)
Hemoglobin: 8.8 g/dL — ABNORMAL LOW (ref 12.0–15.0)
MCH: 31.3 pg (ref 26.0–34.0)
MCHC: 34.1 g/dL (ref 30.0–36.0)
MCV: 91.8 fL (ref 78.0–100.0)
Platelets: 162 10*3/uL (ref 150–400)
RBC: 2.81 MIL/uL — ABNORMAL LOW (ref 3.87–5.11)
RDW: 13.1 % (ref 11.5–15.5)
WBC: 10 10*3/uL (ref 4.0–10.5)

## 2017-01-01 MED ORDER — SODIUM CHLORIDE 0.9 % IV BOLUS (SEPSIS)
250.0000 mL | Freq: Once | INTRAVENOUS | Status: AC
  Administered 2017-01-01: 11:00:00 250 mL via INTRAVENOUS

## 2017-01-01 NOTE — Care Management Note (Signed)
Case Management Note  Patient Details  Name: Ashley MurrainMary N Miranda MRN: [id number] Date of Birth: 1940-09-23  Subjective/Objective:76 y/o f admitted w/R revidion R THA. From home. Has rw,3n1. No PT/OT f/u. No CM needs.                    Action/Plan:d/c home.   Expected Discharge Date:  01/01/17               Expected Discharge Plan:  Home/Self Care  In-House Referral:     Discharge planning Services  CM Consult  Post Acute Care Choice:    Choice offered to:     DME Arranged:    DME Agency:     HH Arranged:    HH Agency:     Status of Service:  Completed, signed off  If discussed at [employer] Length of Stay Meetings, dates discussed:    Additional Comments:  Lanier ClamMahabir, Chandrea Zellman, RN 01/01/2017, 2:58 PM

## 2017-01-01 NOTE — Op Note (Addendum)
NAMEICYSS, Kari Flores              ACCOUNT NO.:  [id number]  MEDICAL RECORD NO.:  [id number]  LOCATION:                                 FACILITY:  PHYSICIAN:  Madlyn Frankel. Charlann Boxer, M.D.       DATE OF BIRTH:  DATE OF PROCEDURE:  12/31/2016 DATE OF DISCHARGE:                              OPERATIVE REPORT   PREOPERATIVE DIAGNOSIS:  Failed right total hip arthroplasty.  POSTOPERATIVE DIAGNOSIS:  Failed right total hip arthroplasty.  PROCEDURE:  Revision right total hip arthroplasty.  COMPONENTS USED:  Size 54 Gription Pinnacle shell; 36, +4 neutral AltrX liner; a size 2 high Tri-Lock stem with a 36, +5 Delta ceramic ball.  SURGEON:  Madlyn Frankel. Charlann Boxer, M.D.  ASSISTANT:  Lanney Gins, PA-C.  ANESTHESIA:  Spinal.  SPECIMENS:  None.  COMPLICATION:  None.  BLOOD LOSS:  600 mL.  DRAINS:  None.  FINDINGS:  See the body of paragraph for description.  INDICATIONS FOR PROCEDURE:  Kari Flores is a very pleasant 76 year old female with a history of right total hip arthroplasty in August 2017.  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  Based on her complaints in the office including some hip girdle pain, thigh pain, as well as this knee issue, I had a lengthy discussion with Kari Flores that it did not make sense to me while she had not recovered radiographically, it appeared as if the femoral component had subsided a little bit but perhaps had some early bone growth at this point.  Despite this, I felt that she was not recovering in a standard fashion and felt it was going to be in her best interest to proceed with revision surgery. Reviewed the risks and benefits of infection, DVT, dislocation, need for future surgery, revision setting, and she wished to proceed with the plan to perform this revision of right hip surgery and then proceed on with the knee.  Consent  was obtained for benefit of pain relief.  PROCEDURE IN DETAIL:  The patient was brought to the operative theater, once adequate anesthesia, preoperative antibiotics, Ancef administered as well as Decadron.  Please note, that topical TXA was utilized.  She was positioned supine on the Hana table.  Once adequately and in an appropriate position with bony prominences padded, fluoroscopic imaging was used to identify orientation of the pelvis.  Her right hip incision was identified.  The right lower extremity was then prepped and draped in sterile fashion for the right hip surgery. Time-out was performed identifying the patient, planned procedure, and extremity.  At this point, her incision was excised.  Soft tissue dissection to the fascia.  Tensor fascia was exposed, and this was incised in line with the fascial fibers.  The muscle belly was identified fairly easily without significant scarring and swept laterally.  Due to the shortening of the stem, the superior landmarks little bit more of a challenge, but I was able to place an inferior retractor along the neck.  This was actually confirmed radiographically.  I then spent the vast majority of time at this point exposing the anterior two-thirds of  the hip.  This required the removal of a tremendous amount of scar tissue.  Once I did have this exposed and retractors placed in and again confirmed radiographically, with traction, internal and external rotation, I was able to dislocate her femoral head.  I then relocated the hip.  I then applied traction.  Then using a bone tamp, tapped off the femoral head. Then, with traction applied, we internally rotated the hip a little bit, and I was able to move her femoral head.  At this point, I externally rotated the femur about 100 degrees and took traction off, placed it along the superior ilium of the acetabulum. When I evaluated her acetabulum, I was a little bit worried about the position  of her cup as I was unable to palpate in the anterior wall.  Given these findings, I did go ahead and removed her acetabular liner, removed the cancellous screw in the bone in the hole eliminator.  At this point, we did open up cutter to perform an acetabular revision. However, while we were getting this information, I then focused on the femur.  The lateral hook was applied underneath the vastus lateralis. The femur rotated 100 degrees.  It was elevated manually, held in position with a bone hook as the leg was extended and adducted.  At this point, I spent a lot of time exposing the proximal aspect of the hip, removing a lot of scar tissue in this area.  I removed this circumferentially as visible around the proximal aspect of the femoral stem.  I then began using thin flexible osteotomes and worked around her stem.  Importantly, the stem was not grossly loose at this point to manual movement.  The thin osteotomes were used around the neck carefully on the inferior calcar as well as anterior and posterior.  After I had felt like I had gotten good passing of these flexible osteotomes, I did apply a loop extractor and was able then to remove the femoral stem without proximal femur complications.  Once this was done following irrigation and debridement of the proximal femur, I returned focus to the acetabulum.  The lateral arm was removed. The leg was brought into a neutral position, and then retractors were placed to focus on the acetabulum.  With the acetabulum exposed at this point, we used the Innomed cup explant system and removed the acetabular shell with minimal bone loss.  I removed a 52-mm cup.  I began reaming with a 48-mm reamer to medialize and then reamed up to a 53-mm reamer. I selected a 54 pinnacle Gription cup in this revision setting.  Under fluoroscopic image, I impacted the cup into a position I felt was better oriented, and this allowed me to palpate the anterior  rim of the acetabulum.  Given this finding, I placed a single screw into the ilium and the hole eliminator again, the final 36, +4 AltrX liner was then impacted.  At this point, attention was directed back to the femur and with the femur exposed, carefully with a lateral hook in place and the leg extended and adducted.  I began broaching, I used a box osteotome to get a little bit more lateral.  She had 0 stem that was removed.  I began broaching with 0 stem and then broached up to a size 2.  With this, it sat more at the level of the initial neck cut, if not maybe a millimeter proud.  I did a trial reduction with this.  I did use the 36, +1.5 and then a +5 and felt that the stability offset and even though she was a little bit longer on this side at this point, this was a better option to restore the anatomy of her proximal femur.  Given these findings, the trial of the hip was dislocated.  The hip was repositioned.  The proximal femurs were repositioned to allow for placement of the final stem.  Trial components removed, and the final 2 high Tri-Lock stem was opened and impacted and sat at the level where the broach was.  Based on this trial reduction, I selected a 36, +5 ball.  Again, she was a few millimeters longer on this right side, but I felt was acceptable not only due to what she had been dealing with to try to restore proximal femur anatomy, but also due to her flexion contracture of the knee and the planned surgical procedure on the knee, which was going to require removing portion of the distal femur to allow for a full extension.  All of these factors were taken into consideration for the final implant placement.  The hip was reduced.  The hip had been irrigated throughout the case and again at this point, I reapproximated the fascia, the tensor fascia muscle using #1 Vicryl and 0 V-Loc suture.  The remainder of the wound was closed with 2-0 Vicryl and a running 3-0  Monocryl.  The hip was cleaned, dried, and dressed sterilely using surgical glue and Aquacel dressing.  The patient was then brought to the recovery room in stable condition tolerating the procedure well.  Findings reviewed with family.  I am going to have her be partial weightbearing at this point for the first 4 to 6 weeks with the use of a walker to make certain we have the best opportunity for bony ingrowth.     [name], M.D.     MDO/MEDQ  D:  12/31/2016  T:  12/31/2016  Job:  [id number]

## 2017-01-01 NOTE — Evaluation (Signed)
Physical Therapy Evaluation Patient Details Name: Kari Flores MRN: [id number] DOB: 1941/05/12 Today's Date: 01/01/2017   History of Present Illness  Pt is a 76 year old female s/p R anterior THA with hx of right total knee arthroplasty with the revision surgery and persistent flexion contracture  Clinical Impression  Patient evaluated by Physical Therapy with no further acute PT needs identified. All education has been completed and the patient has no further questions.  Pt educated on maintaining PWB with ambulation, step and car transfer.  Pt performed seated/supine exercises in recliner and provided with HEP handout.  Pt did not perform standing exercises however aware to only exercise R LE with stance on L LE and not perform stance on surgical leg due to [facility] precaution at this time.  Pt reports understanding.  Pt feels comfortable with no f/u PT needs. PT is signing off. Thank you for this referral.     Follow Up Recommendations No PT follow up;DC plan and follow up therapy as arranged by surgeon    Equipment Recommendations  None recommended by PT    Recommendations for Other Services       Precautions / Restrictions Precautions Precautions: Fall Restrictions Weight Bearing Restrictions: Yes RLE Weight Bearing: Partial weight bearing Other Position/Activity Restrictions: PWB      Mobility  Bed Mobility               General bed mobility comments: pt up in chair  Transfers Overall transfer level: Needs assistance Equipment used: Rolling walker (2 wheeled) Transfers: Sit to/from Stand Sit to Stand: Supervision Stand pivot transfers: Supervision       General transfer comment: supervision for safety, verbal cues for technique and PWB status  Ambulation/Gait Ambulation/Gait assistance: Supervision;Min guard Ambulation Distance (Feet): 80 Feet Assistive device: Rolling walker (2 wheeled) Gait Pattern/deviations: Step-to pattern     General Gait Details:  verbal cues for step to pattern and PWB R LE, pt performs well  Stairs Stairs: Yes Stairs assistance: Min guard Stair Management: Step to pattern;Backwards;With walker Number of Stairs: 1 General stair comments: verbal cues for sequence and safety, performed backwards to assist pt with maintaining PWB, pt reports understanding  Wheelchair Mobility    Modified Rankin (Stroke Patients Only)       Balance                                             Pertinent Vitals/Pain Pain Assessment: 0-10 Pain Score: 2  Pain Location: R hip Pain Descriptors / Indicators: Sore Pain Intervention(s): Limited activity within patient's tolerance;Repositioned;Monitored during session;Ice applied    Home Living Family/patient expects to be discharged to:: Private residence Living Arrangements: Spouse/significant other Available Help at Discharge: Family Type of Home: House Home Access: Stairs to enter Entrance Stairs-Rails: None Entrance Stairs-Number of Steps: 1 Home Layout: Able to live on main level with bedroom/bathroom Home Equipment: Walker - 2 wheels      Prior Function Level of Independence: Independent               Hand Dominance        Extremity/Trunk Assessment   Upper Extremity Assessment Upper Extremity Assessment: Overall WFL for tasks assessed    Lower Extremity Assessment Lower Extremity Assessment: RLE deficits/detail RLE Deficits / Details: anticipated post op hip weakness, also with knee flexion contracture - knee flexion limited to approx  45* actively       Communication   Communication: No difficulties  Cognition Arousal/Alertness: Awake/alert Behavior During Therapy: WFL for tasks assessed/performed Overall Cognitive Status: Within Functional Limits for tasks assessed                                        General Comments      Exercises Total Joint Exercises Ankle Circles/Pumps: AROM;Both;10 reps Quad  Sets: AROM;Both;10 reps Short Arc Quad: Seated;AROM;Right;10 reps Heel Slides: AAROM;Right;10 reps Hip ABduction/ADduction: AAROM;10 reps;Right Straight Leg Raises: AAROM;Right;10 reps   Assessment/Plan    PT Assessment Patent does not need any further PT services  PT Problem List         PT Treatment Interventions      PT Goals (Current goals can be found in the Care Plan section)  Acute Rehab PT Goals Patient Stated Goal: home today PT Goal Formulation: All assessment and education complete, DC therapy    Frequency     Barriers to discharge        Co-evaluation               AM-PAC PT "6 Clicks" Daily Activity  Outcome Measure Difficulty turning over in bed (including adjusting bedclothes, sheets and blankets)?: None Difficulty moving from lying on back to sitting on the side of the bed? : None Difficulty sitting down on and standing up from a chair with arms (e.g., wheelchair, bedside commode, etc,.)?: None Help needed moving to and from a bed to chair (including a wheelchair)?: A Little Help needed walking in hospital room?: A Little Help needed climbing 3-5 steps with a railing? : A Little 6 Click Score: 21    End of Session Equipment Utilized During Treatment: Gait belt Activity Tolerance: Patient tolerated treatment well Patient left: in chair;with call bell/phone within reach Nurse Communication: Mobility status PT Visit Diagnosis: Difficulty in walking, not elsewhere classified (R26.2)    Time: 8295-62131117-1147 PT Time Calculation (min) (ACUTE ONLY): 30 min   Charges:   PT Evaluation $PT Eval Low Complexity: 1 Procedure PT Treatments $Gait Training: 8-22 mins   PT G Codes:        Zenovia JarredKati Jakeel Starliper, PT, DPT 01/01/2017 Pager: [phone]  Maida SaleLEMYRE,KATHrine E 01/01/2017, 1:07 PM

## 2017-01-01 NOTE — Evaluation (Signed)
Occupational Therapy Evaluation Patient Details Name: Kari MurrainMary N Flores MRN: [id number] DOB: March 25, 1941 Today's Date: 01/01/2017    History of Present Illness RIGHT ANTERIOR HIP REVISION    Clinical Impression   OT education complete.  Education completed regarding PWB with ADL activity   Follow Up Recommendations  No OT follow up    Equipment Recommendations  None recommended by OT       Precautions / Restrictions Restrictions Weight Bearing Restrictions: Yes Other Position/Activity Restrictions: PWB      Mobility Bed Mobility               General bed mobility comments: pt in chair  Transfers Overall transfer level: Needs assistance   Transfers: Sit to/from Stand;Stand Pivot Transfers Sit to Stand: Supervision Stand pivot transfers: Supervision       General transfer comment: VC for hand placement and PWB        ADL either performed or assessed with clinical judgement   ADL Overall ADL's : Needs assistance/impaired                     Lower Body Dressing: Minimal assistance;Sit to/from stand;Cueing for safety;Cueing for sequencing Lower Body Dressing Details (indicate cue type and reason): educated pt would benefit from a shoe horn Toilet Transfer: Supervision/safety;RW;Ambulation;Comfort height toilet   Toileting- Clothing Manipulation and Hygiene: Supervision/safety;Sit to/from stand;Cueing for safety;Cueing for sequencing   Tub/ Shower Transfer: Walk-in shower;Ambulation;Min guard Tub/Shower Transfer Details (indicate cue type and reason): VC fpr technique as pt is PWB Functional mobility during ADLs: Supervision/safety General ADL Comments: husband will A as needed. Education complete regarding ADL activity and PWB     Vision Patient Visual Report: No change from baseline              Pertinent Vitals/Pain Pain Assessment: 0-10 Pain Score: 3  Pain Location: R hip Pain Descriptors / Indicators: Sore Pain Intervention(s): Limited  activity within patient's tolerance     Hand Dominance     Extremity/Trunk Assessment Upper Extremity Assessment Upper Extremity Assessment: Overall WFL for tasks assessed           Communication Communication Communication: No difficulties   Cognition Arousal/Alertness: Awake/alert Behavior During Therapy: WFL for tasks assessed/performed Overall Cognitive Status: Within Functional Limits for tasks assessed                                                Home Living Family/patient expects to be discharged to:: Private residence Living Arrangements: Spouse/significant other Available Help at Discharge: Family Type of Home: House Home Access: Stairs to enter   Entrance Stairs-Rails: None Home Layout: Able to live on main level with bedroom/bathroom     Bathroom Shower/Tub: Producer, television/film/videoWalk-in shower   Bathroom Toilet: Standard     Home Equipment: Environmental consultantWalker - 2 wheels                            OT Goals(Current goals can be found in the care plan section) Acute Rehab OT Goals Patient Stated Goal: home today OT Goal Formulation: With patient  OT Frequency:                AM-PAC PT "6 Clicks" Daily Activity     Outcome Measure Help from another person eating meals?: None Help from another person taking  care of personal grooming?: None Help from another person toileting, which includes using toliet, bedpan, or urinal?: A Little Help from another person bathing (including washing, rinsing, drying)?: A Little Help from another person to put on and taking off regular upper body clothing?: None Help from another person to put on and taking off regular lower body clothing?: A Little 6 Click Score: 21   End of Session    Activity Tolerance: Patient tolerated treatment well Patient left: in chair;with call bell/phone within reach                   Time: 1157-1213 OT Time Calculation (min): 16 min Charges:  OT General Charges $OT Visit: 1  Procedure OT Evaluation $OT Eval Low Complexity: 1 Procedure G-Codes:     Lise Auer, OT [phone]  Einar Crow D 01/01/2017, 12:36 PM

## 2017-01-01 NOTE — Progress Notes (Signed)
Made Izetta DakinMatthew Babbish,PA aware of patients blood pressure of 100/57. New orders to hold metoprolol and hctz until systolic bp is 120 or above and to administer a bolus of 250mL.

## 2017-01-01 NOTE — Progress Notes (Signed)
Patient ID: Kari MurrainMary N Rosales, female   DOB: 1940/12/06, 76 y.o.   MRN: [id number] Subjective: 1 Day Post-Op Procedure(s) (LRB): RIGHT ANTERIOR HIP REVISION (Right)    Patient reports pain as mild to moderate.  Was able to get out of bed with staff last night.  No events.  Reviewed intra-op findings and procedure  Objective:   VITALS:   Vitals:   01/01/17 0554 01/01/17 0847  BP: (!) 126/55 (!) 100/57  Pulse: (!) 57 (!) 56  Resp: 16   Temp: 98.1 F (36.7 C)     Neurovascular intact Incision: dressing C/D/I  LABS  Recent Labs  01/01/17 0421  HGB 8.8*  HCT 25.8*  WBC 10.0  PLT 162     Recent Labs  01/01/17 0421  NA 139  K 3.9  BUN 12  CREATININE 0.72  GLUCOSE 148*    No results for input(s): LABPT, INR in the last 72 hours.   Assessment/Plan: 1 Day Post-Op Procedure(s) (LRB): RIGHT ANTERIOR HIP REVISION (Right)   Advance diet Up with therapy  May be able to go home today if she progresses well with PT and feels comfortable doing so Would like for her to be PWB for 2-6 weeks, will follow this up in the office at first post-op visit

## 2017-01-02 LAB — POCT I-STAT 4, (NA,K, GLUC, HGB,HCT)
Glucose, Bld: 107 mg/dL — ABNORMAL HIGH (ref 65–99)
HEMATOCRIT: 35 % — AB (ref 36.0–46.0)
HEMOGLOBIN: 11.9 g/dL — AB (ref 12.0–15.0)
Potassium: 3.8 mmol/L (ref 3.5–5.1)
SODIUM: 143 mmol/L (ref 135–145)

## 2017-01-04 LAB — TYPE AND SCREEN
ABO/RH(D): O POS
ANTIBODY SCREEN: NEGATIVE
UNIT DIVISION: 0
Unit division: 0

## 2017-01-04 LAB — BPAM RBC
Blood Product Expiration Date: 201807242359
Blood Product Expiration Date: 201807242359
ISSUE DATE / TIME: 201806251542
ISSUE DATE / TIME: 201806251542
UNIT TYPE AND RH: 5100
Unit Type and Rh: 5100

## 2017-01-10 NOTE — Discharge Summary (Signed)
Physician Discharge Summary  Patient ID: Kari Flores Miltenberger MRN: [id number] DOB/AGE: [birth year] 74 y.o.  Admit date: 12/31/2016 Discharge date: 01/01/2017   Procedures:  Procedure(s) (LRB): RIGHT ANTERIOR HIP REVISION (Right)  Attending Physician:  Dr. Durene RomansMatthew Olin   Admission Diagnoses:   Failed right THA, femoral subsidence  Discharge Diagnoses:  Principal Problem:   S/P right TH revision  Past Medical History:  Diagnosis Date  . Arthritis   . GERD (gastroesophageal reflux disease)   . H/O blood clots    after knee surgery -1 st surgery right knee-in lower leg  . Hypertension   . Stroke [facility]) 10-12 yrs ago   no deficits-on Aggrenox- sometime before first knee surgery    HPI:    Kari Flores Demeter, 76 y.o. female, has a history of pain and functional disability in the right hip due to subsidence of the femoral component and patient has failed non-surgical conservative treatments for greater than 12 weeks to include NSAID's and/or analgesics, use of assistive devices and activity modification. The indications for the revision total hip arthroplasty are loosening of one or more components. Onset of symptoms was gradual starting ~1 years ago with gradually worsening course since that time.  Prior procedures on the right hip include arthroplasty in August 2017 per Dr. Charlann Boxerlin.  Patient currently rates pain in the right hip at 5 out of 10 with activity.  There is night pain, worsening of pain with activity and weight bearing, trendelenberg gait, pain that interfers with activities of daily living and pain with passive range of motion. Patient has evidence of prosthetic loosening by imaging studies.  This condition presents safety issues increasing the risk of falls.   There is no current active infection.   Risks, benefits and expectations were discussed with the patient.  Risks including but not limited to the risk of anesthesia, blood clots, nerve damage, blood vessel damage, failure of the  prosthesis, infection and up to and including death.  Patient understand the risks, benefits and expectations and wishes to proceed with surgery.  PCP: Deloris Pingyter-Brown, Sherry M, MD   Discharged Condition: good  Hospital Course:  Patient underwent the above stated procedure on 12/31/2016. Patient tolerated the procedure well and brought to the recovery room in good condition and subsequently to the floor.  POD #1 BP: 100/57 ; Pulse: 56 ; Temp: 98.1 F (36.7 C) ; Resp: 16 Patient reports pain as mild to moderate.  Was able to get out of bed with staff last night.  No events.  Reviewed intra-op findings and procedure. Neurovascular intact and incision: dressing C/D/I.  LABS  Basename    HGB     8.8  HCT     25.8    Discharge Exam: General appearance: alert, cooperative and no distress Extremities: Homans sign is negative, no sign of DVT, no edema, redness or tenderness in the calves or thighs and no ulcers, gangrene or trophic changes  Disposition: Home with follow up in 2 weeks   Follow-up Information    Durene Romanslin, Shamari Lofquist, MD. Schedule an appointment as soon as possible for a visit in 2 week(s).   Specialty:  Orthopedic Surgery Contact information: [address] [place] [address] [phone]           Discharge Instructions    Call MD / Call 911    Complete by:  As directed    If you experience chest pain or shortness of breath, CALL 911 and be transported to the hospital  emergency room.  If you develope a fever above 101 F, pus (white drainage) or increased drainage or redness at the wound, or calf pain, call your surgeon's office.   Change dressing    Complete by:  As directed    Maintain surgical dressing until follow up in the clinic. If the edges start to pull up, may reinforce with tape. If the dressing is no longer working, may remove and cover with gauze and tape, but must keep the area dry and clean.  Call with any questions or concerns.    Constipation Prevention    Complete by:  As directed    Drink plenty of fluids.  Prune juice may be helpful.  You may use a stool softener, such as Colace (over the counter) 100 mg twice a day.  Use MiraLax (over the counter) for constipation as needed.   Diet - low sodium heart healthy    Complete by:  As directed    Discharge instructions    Complete by:  As directed    Maintain surgical dressing until follow up in the clinic. If the edges start to pull up, may reinforce with tape. If the dressing is no longer working, may remove and cover with gauze and tape, but must keep the area dry and clean.  Follow up in 2 weeks at [facility]. Call with any questions or concerns.   Partial weight bearing    Complete by:  As directed    % Body Weight:  50   Laterality:  right   Extremity:  Lower   PWB for 2-6 weeks.   TED hose    Complete by:  As directed    Use stockings (TED hose) for 2 weeks on both leg(s).  You may remove them at night for sleeping.      Allergies as of 01/01/2017   No Known Allergies     Medication List    TAKE these medications   amLODipine 2.5 MG tablet Commonly known as:  NORVASC Take 2.5 mg by mouth at bedtime.   amoxicillin 500 MG capsule Commonly known as:  AMOXIL Take 2,000 mg by mouth See admin instructions. Take 4 capsules (2000 mg) prior to dental procedures   atorvastatin 20 MG tablet Commonly known as:  LIPITOR Take 20 mg by mouth at bedtime.   dipyridamole-aspirin 200-25 MG 12hr capsule Commonly known as:  AGGRENOX Take 1 capsule by mouth 2 (two) times daily. Start the day after finishing the Xarelto. What changed:  additional instructions   docusate sodium 100 MG capsule Commonly known as:  COLACE Take 1 capsule (100 mg total) by mouth 2 (two) times daily.   ferrous sulfate 325 (65 FE) MG tablet Take 1 tablet (325 mg total) by mouth 3 (three) times daily after meals.   hydrochlorothiazide 25 MG tablet Commonly known as:   HYDRODIURIL Take 12.5 mg by mouth every morning.   HYDROcodone-acetaminophen 7.5-325 MG tablet Commonly known as:  NORCO Take 1-2 tablets by mouth every 4 (four) hours as needed for moderate pain or severe pain.   methocarbamol 500 MG tablet Commonly known as:  ROBAXIN Take 1 tablet (500 mg total) by mouth every 6 (six) hours as needed for muscle spasms.   metoprolol succinate 100 MG 24 hr tablet Commonly known as:  TOPROL-XL Take 50-100 mg by mouth 2 (two) times daily. Takes one tablet in the morning (100mg ) and half a tablet (50mg ) at night.   polyethylene glycol packet Commonly known as:  MIRALAX / GLYCOLAX Take 17 g by mouth 2 (two) times daily.   potassium chloride 10 MEQ tablet Commonly known as:  K-DUR Take 10 mEq by mouth 2 (two) times daily.        Signed: Anastasio Auerbach. Shaia Porath   PA-C  01/10/2017, 8:50 AM

## 2017-02-19 ENCOUNTER — Ambulatory Visit
Admission: RE | Admit: 2017-02-19 | Discharge: 2017-02-19 | Disposition: A | Discharge status: Home / Self Care | Payer: Medicare Other | Source: Ambulatory Visit | Attending: Radiation Oncology | Admitting: Radiation Oncology

## 2017-02-19 ENCOUNTER — Telehealth: Payer: Self-pay | Admitting: *Deleted

## 2017-02-19 ENCOUNTER — Encounter: Payer: Self-pay | Admitting: Radiation Oncology

## 2017-02-19 ENCOUNTER — Ambulatory Visit: Admission: RE | Admit: 2017-02-19 | Payer: Medicare Other | Source: Ambulatory Visit

## 2017-02-19 VITALS — BP 185/64 | HR 66 | Temp 98.0°F | Resp 20 | Ht 63.0 in | Wt 138.6 lb

## 2017-02-19 DIAGNOSIS — M25851 Other specified joint disorders, right hip: Secondary | ICD-10-CM | POA: Diagnosis present

## 2017-02-19 DIAGNOSIS — M6140 Other calcification of muscle, unspecified site: Secondary | ICD-10-CM

## 2017-02-19 DIAGNOSIS — K219 Gastro-esophageal reflux disease without esophagitis: Secondary | ICD-10-CM | POA: Insufficient documentation

## 2017-02-19 DIAGNOSIS — I10 Essential (primary) hypertension: Secondary | ICD-10-CM | POA: Insufficient documentation

## 2017-02-19 DIAGNOSIS — Z96649 Presence of unspecified artificial hip joint: Secondary | ICD-10-CM

## 2017-02-19 DIAGNOSIS — Z7982 Long term (current) use of aspirin: Secondary | ICD-10-CM | POA: Diagnosis not present

## 2017-02-19 DIAGNOSIS — Z8673 Personal history of transient ischemic attack (TIA), and cerebral infarction without residual deficits: Secondary | ICD-10-CM | POA: Insufficient documentation

## 2017-02-19 DIAGNOSIS — Z510 Encounter for antineoplastic radiation therapy: Secondary | ICD-10-CM | POA: Diagnosis not present

## 2017-02-19 DIAGNOSIS — M898X9 Other specified disorders of bone, unspecified site: Secondary | ICD-10-CM

## 2017-02-19 DIAGNOSIS — Z96641 Presence of right artificial hip joint: Secondary | ICD-10-CM

## 2017-02-19 DIAGNOSIS — M1990 Unspecified osteoarthritis, unspecified site: Secondary | ICD-10-CM | POA: Insufficient documentation

## 2017-02-19 DIAGNOSIS — M9689 Other intraoperative and postprocedural complications and disorders of the musculoskeletal system: Secondary | ICD-10-CM

## 2017-02-19 NOTE — Telephone Encounter (Signed)
Called and spoke with Thayer Ohmhris at Dr. Charlann Boxerlin office, she will fax over last office visit done 02/13/17, thanked Thayer OhmChris, our fax # [phone] was given to her 3:04 PM

## 2017-02-19 NOTE — Progress Notes (Addendum)
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

## 2017-02-19 NOTE — Progress Notes (Signed)
Radiation Oncology         [phone] ________________________________  Name: Kari Flores        MRN: [id number]   Date of Service: 02/19/2017 DOB: [birth year]  WU:[id number], Fritzi Mandes, MD  Durene Romans, MD     REFERRING PHYSICIAN: Durene Romans, MD   DIAGNOSIS: The primary encounter diagnosis was Postoperative heterotopic calcification. Diagnoses of Heterotopic ossification of bone, S/P right TH revision, and Status post right hip replacement were also pertinent to this visit.   HISTORY OF PRESENT ILLNESS: Kari Flores is a 76 y.o. female seen at the request of Dr. Charlann Boxer. She underwent arthoplasty of the right hip in August 2017 due to osteoarthritis. The patient was admitted to the hospital on 12/31/16 for a revision of failed right THA with femoral subsidence. Postoperatively she had an x-ray in Dr. Nilsa Nutting office that appeared to show concerns of heterotopic ossification along the hardware from her recent surgery. The patient presents today to discuss postoperative radiation to help prevent additional development of ossification.    PREVIOUS RADIATION THERAPY: No   PAST MEDICAL HISTORY:  Past Medical History:  Diagnosis Date  . Arthritis   . GERD (gastroesophageal reflux disease)   . H/O blood clots    after knee surgery -1 st surgery right knee-in lower leg  . Hypertension   . Stroke [facility]) 10-12 yrs ago   no deficits-on Aggrenox- sometime before first knee surgery      PAST SURGICAL HISTORY: Past Surgical History:  Procedure Laterality Date  . ANTERIOR HIP REVISION Right 12/31/2016   Procedure: RIGHT ANTERIOR HIP REVISION;  Surgeon: Durene Romans, MD;  Location: [facility];  Service: Orthopedics;  Laterality: Right;  120 mins  . bone fragment removed     3rd surgery  . CYSTOSCOPY WITH STENT PLACEMENT     years ago  . JOINT REPLACEMENT  yrs ago   right knee  . revision right knee     2nd time  . TONSILLECTOMY    . TOTAL HIP ARTHROPLASTY Right 02/07/2016   Procedure: RIGHT TOTAL HIP ARTHROPLASTY ANTERIOR APPROACH;  Surgeon: Durene Romans, MD;  Location: [facility];  Service: Orthopedics;  Laterality: Right;  . TOTAL KNEE REVISION Right 04/12/2014   Procedure: REVISION RIGHT TOTAL KNEE REPLACEMENT/TIABIAL TRAY VS TOTAL;  Surgeon: Shelda Pal, MD;  Location: [facility];  Service: Orthopedics;  Laterality: Right;  . tumor  on rib     30 years ago-benign     FAMILY HISTORY: No family history on file.   SOCIAL HISTORY:  reports that she has never smoked. She has never used smokeless tobacco. She reports that she drinks alcohol. She reports that she does not use drugs. She is married and lives in [place], [place].   ALLERGIES: Patient has no known allergies.   MEDICATIONS:  Current Outpatient Prescriptions  Medication Sig Dispense Refill  . amLODipine (NORVASC) 2.5 MG tablet Take 2.5 mg by mouth at bedtime.    . dipyridamole-aspirin (AGGRENOX) 200-25 MG 12hr capsule Take 1 capsule by mouth 2 (two) times daily. Start the day after finishing the Xarelto. (Patient taking differently: Take 1 capsule by mouth 2 (two) times daily. )    . hydrochlorothiazide (HYDRODIURIL) 25 MG tablet Take 12.5 mg by mouth every morning.     . metoprolol succinate (TOPROL-XL) 100 MG 24 hr tablet Take 50-100 mg by mouth 2 (two) times daily. Takes one tablet in the morning (100mg ) and half a tablet (50mg ) at night.    Marland Kitchen  polyethylene glycol (MIRALAX / GLYCOLAX) packet Take 17 g by mouth 2 (two) times daily. 14 each 0  . potassium chloride (K-DUR) 10 MEQ tablet Take 10 mEq by mouth 2 (two) times daily.    Marland Kitchen. amoxicillin (AMOXIL) 500 MG capsule Take 2,000 mg by mouth See admin instructions. Take 4 capsules (2000 mg) prior to dental procedures    . atorvastatin (LIPITOR) 20 MG tablet Take 20 mg by mouth at bedtime.    . docusate sodium (COLACE) 100 MG capsule Take 1 capsule (100 mg total) by mouth 2 (two) times daily. (Patient not taking: Reported on 02/19/2017) 10 capsule 0  . ferrous  sulfate 325 (65 FE) MG tablet Take 1 tablet (325 mg total) by mouth 3 (three) times daily after meals. (Patient not taking: Reported on 02/19/2017)  3  . HYDROcodone-acetaminophen (NORCO) 7.5-325 MG tablet Take 1-2 tablets by mouth every 4 (four) hours as needed for moderate pain or severe pain. (Patient not taking: Reported on 02/19/2017) 60 tablet 0  . methocarbamol (ROBAXIN) 500 MG tablet Take 1 tablet (500 mg total) by mouth every 6 (six) hours as needed for muscle spasms. (Patient not taking: Reported on 02/19/2017) 40 tablet 0   No current facility-administered medications for this encounter.      REVIEW OF SYSTEMS: On review of systems, the patient reports that she is doing well overall. She notes minimal hip pain in the right hip. She is not taking narcotics regularly at this time. She denies any restricted range of motion since her surgery. She denies any chest pain, shortness of breath, cough, fevers, chills, night sweats, unintended weight changes. She denies any bowel or bladder disturbances, and denies abdominal pain, nausea or vomiting.She denies any new musculoskeletal or joint aches or pains. A complete review of systems is obtained and is otherwise negative.   PHYSICAL EXAM:  Wt Readings from Last 3 Encounters:  02/19/17 138 lb 9.6 oz (62.9 kg)  12/31/16 141 lb (64 kg)  12/26/16 141 lb 6.4 oz (64.1 kg)   Temp Readings from Last 3 Encounters:  02/19/17 98 F (36.7 C) (Oral)  01/01/17 97.8 F (36.6 C) (Oral)  12/26/16 98.3 F (36.8 C) (Oral)   BP Readings from Last 3 Encounters:  02/19/17 (!) 185/64  01/01/17 (!) 91/42  12/26/16 (!) 159/72   Pulse Readings from Last 3 Encounters:  02/19/17 66  01/01/17 61  12/26/16 (!) 58   Pain Assessment Pain Score: 2  Pain Loc: Hip (right sore)/10  In general this is a well appearing caucasian female in no acute distress. She is alert and oriented x4 and appropriate throughout the examination. HEENT reveals that the patient is  normocephalic, atraumatic. EOMs are intact. PERRLA. Skin is intact without any evidence of gross lesions. Cardiopulmonary assessment is negative for acute distress and she exhibits normal effort. The patient has a slow gait with minimal limping on the right while walking in our clinic.  ECOG = 0  0 - Asymptomatic (Fully active, able to carry on all predisease activities without restriction)  1 - Symptomatic but completely ambulatory (Restricted in physically strenuous activity but ambulatory and able to carry out work of a light or sedentary nature. For example, light housework, office work)  2 - Symptomatic, <50% in bed during the day (Ambulatory and capable of all self care but unable to carry out any work activities. Up and about more than 50% of waking hours)  3 - Symptomatic, >50% in bed, but not bedbound (Capable of  only limited self-care, confined to bed or chair 50% or more of waking hours)  4 - Bedbound (Completely disabled. Cannot carry on any self-care. Totally confined to bed or chair)  5 - Death   Santiago Glad MM, Creech RH, Tormey DC, et al. [phone]). "Toxicity and response criteria of the [facility]". Am. Evlyn Clines. Oncol. 5 (6): 649-55    LABORATORY DATA:  Lab Results  Component Value Date   WBC 10.0 01/01/2017   HGB 8.8 (L) 01/01/2017   HCT 25.8 (L) 01/01/2017   MCV 91.8 01/01/2017   PLT 162 01/01/2017   Lab Results  Component Value Date   NA 139 01/01/2017   K 3.9 01/01/2017   CL 108 01/01/2017   CO2 26 01/01/2017   No results found for: ALT, AST, GGT, ALKPHOS, BILITOT    RADIOGRAPHY: No results found.     IMPRESSION/PLAN: 1. Right hip heterotopic ossification. Dr. Mitzi Hansen meets with the patient and she appears to be a good candidate for one fraction of postoperative radiation treatment forprevention of continued development of heterotopic ossification. Dr. Mitzi Hansen discussed the possible/expected benefit of such a treatment. We have also discussed  the possible side effects and risks of treatment as well. All of the patient's questions have been answered. The patient will undergo simulation and one fraction of external beam radiation treatment. This will be completed to a dose of 7 Gy tomorrow.  The above documentation reflects my direct findings during this shared patient visit. Please see the separate note by Dr. Mitzi Hansen on this date for the remainder of the patient's plan of care.    Osker Mason, PAC  This document serves as a record of services personally performed by Laurence Aly, PA-C and Dorothy Puffer, MD. It was created on their behalf by Tor Netters, a trained medical scribe. The creation of this record is based on the scribe's personal observations and the providers' statements to them. This document has been checked and approved by the attending provider.

## 2017-02-20 ENCOUNTER — Ambulatory Visit
Admission: RE | Admit: 2017-02-20 | Discharge: 2017-02-20 | Disposition: A | Discharge status: Home / Self Care | Payer: Medicare Other | Source: Ambulatory Visit | Attending: Radiation Oncology | Admitting: Radiation Oncology

## 2017-02-20 ENCOUNTER — Encounter: Payer: Self-pay | Admitting: Radiation Oncology

## 2017-02-20 DIAGNOSIS — Z510 Encounter for antineoplastic radiation therapy: Secondary | ICD-10-CM | POA: Diagnosis not present

## 2017-02-20 DIAGNOSIS — M898X9 Other specified disorders of bone, unspecified site: Secondary | ICD-10-CM

## 2017-02-22 NOTE — Progress Notes (Signed)
  Radiation Oncology         [phone]) [phone] ________________________________  Name: Kari Flores MRN: [id number]  Date: 02/20/2017  DOB: [birth year]  End of Treatment Note  Diagnosis:   76 y.o. female with right hip heterotopic ossification     Indication for treatment:  Curative       Radiation treatment dates:   02/20/2017  Site/dose:   The right hip was treated to 7 Gy in 1 fraction of 7 Gy.  Beams/energy:   Depth Dose // 6X Photon  Narrative: The patient tolerated radiation treatment relatively well.    Plan: The patient has completed radiation treatment. The patient will return to radiation oncology clinic for routine followup in one month. I advised them to call or return sooner if they have any questions or concerns related to their recovery or treatment.  ------------------------------------------------  Radene Gunning, MD, PhD  This document serves as a record of services personally performed by Dorothy Puffer, MD. It was created on his behalf by Ivar Bury, a trained medical scribe. The creation of this record is based on the scribe's personal observations and the provider's statements to them. This document has been checked and approved by the attending provider.

## 2017-03-02 NOTE — Progress Notes (Signed)
  Radiation Oncology         [phone] ________________________________  Name: Kari Flores MRN: [id number]  Date: 02/20/2017  DOB: 1941-03-16  SIMULATION AND TREATMENT PLANNING NOTE  DIAGNOSIS:     ICD-10-CM   1. Heterotopic ossification of bone M89.8X9      Site:  right hip  NARRATIVE:  The patient was brought to the treatment suite.  Identity was confirmed.  All relevant records and images related to the planned course of therapy were reviewed.   Written consent to proceed with treatment was confirmed which was freely given after reviewing the details related to the planned course of therapy had been reviewed with the patient.  Then, the patient was set-up in a stable reproducible supine position for radiation therapy.  Xrays were obtained of the target hip area.     The images were reviewed and 2 treatment fields were designed and positioned to treat the appropriate target region. A simple isodose plan is requested.  PLAN:  The patient will receive 7 Gy in 1 fraction.    Simulation verification Port films were taken prior to treatment. Each of the treatment fields was reviewed and appropriately delineates the target regions and the patient was appropriate to proceed with radiation treatment.  ________________________________   Radene Gunning, MD, PhD

## 2017-04-04 NOTE — H&P (Signed)
TOTAL KNEE REVISION ADMISSION H&P  Patient is being admitted for right revision total knee arthroplasty.  Subjective:  Chief Complaint:    Failed right TKA  HPI: Kari Flores, 76 y.o. female, has a history of pain and functional disability in the right knee(s) due to failed previous arthroplasty and patient has failed non-surgical conservative treatments for greater than 12 weeks to include NSAID's and/or analgesics and activity modification. The indications for the revision of the total knee arthroplasty are bearing surface wear leading to symptomatic synovitis and implant or knee misalignment. Onset of symptoms was gradual starting  years ago with gradually worsening course since that time.  Prior procedures on the right knee(s) include arthroplasty and and revision arthroplasty.  Patient currently rates pain in the right knee(s) at 10 out of 10 with activity. There is night pain, worsening of pain with activity and weight bearing, pain that interferes with activities of daily living, pain with passive range of motion, crepitus and joint swelling.  Patient has evidence of previous arthroplasty by imaging studies. This condition presents safety issues increasing the risk of falls.   There is no current active infection.  Risks, benefits and expectations were discussed with the patient.  Risks including but not limited to the risk of anesthesia, blood clots, nerve damage, blood vessel damage, failure of the prosthesis, infection and up to and including death.  Patient understand the risks, benefits and expectations and wishes to proceed with surgery.    PCP: Deloris Ping, MD  D/C Plans:       Home   Post-op Meds:       No Rx given   Tranexamic Acid:      To be given - topically  (previous DVT)  Decadron:      Is to be given  FYI:     Aggrenox  Norco  DME:   Pt already has equipment   PT:   OPPT Rx given   Patient Active Problem List   Diagnosis Date Noted  . 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 S/P right TH revision 12/31/2016  . 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 Arthritis   . GERD (gastroesophageal reflux disease)   . H/O blood clots    after knee surgery -1 st surgery right knee-in lower leg  . Hypertension   . Stroke [facility]) 10-12 yrs ago   no deficits-on Aggrenox- sometime before first knee surgery    Past Surgical History:  Procedure Laterality Date  . ANTERIOR HIP REVISION Right 12/31/2016   Procedure: RIGHT ANTERIOR HIP REVISION;  Surgeon: Durene Romans, MD;  Location: [facility] ORS;  Service: Orthopedics;  Laterality: Right;  120 mins  . bone fragment removed     3rd surgery  . CYSTOSCOPY WITH STENT PLACEMENT     years ago  . JOINT REPLACEMENT  yrs ago   right knee  . revision right knee     2nd time  . TONSILLECTOMY    . TOTAL HIP ARTHROPLASTY Right 02/07/2016   Procedure: RIGHT TOTAL HIP ARTHROPLASTY ANTERIOR APPROACH;  Surgeon: Durene Romans, MD;  Location: [facility];  Service: Orthopedics;  Laterality: Right;  . TOTAL KNEE REVISION Right 04/12/2014   Procedure: REVISION RIGHT TOTAL KNEE REPLACEMENT/TIABIAL TRAY VS TOTAL;  Surgeon: Shelda Pal, MD;  Location: [facility];  Service: Orthopedics;  Laterality: Right;  . 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   No Known Allergies   Social History  Substance Use Topics  . Smoking status: Never Smoker  . Smokeless tobacco: Never Used  . Alcohol use Yes     Comment: socially       Review of Systems  Constitutional: Negative.   HENT: Negative.   Eyes: Negative.   Respiratory: Negative.   Cardiovascular: Negative.   Gastrointestinal: Negative.   Genitourinary: Negative.   Musculoskeletal: Positive for joint pain.  Skin: Negative.   Neurological: Negative.   Endo/Heme/Allergies: Negative.   Psychiatric/Behavioral: Negative.      Objective:  Physical Exam   Constitutional: She is oriented to person, place, and time. She appears well-developed.  HENT:  Head: Normocephalic.  Eyes: Pupils are equal, round, and reactive to light.  Neck: Neck supple. No JVD present. No tracheal deviation present. No thyromegaly present.  Cardiovascular: Normal rate, regular rhythm and intact distal pulses.   Respiratory: Effort normal and breath sounds normal. No respiratory distress. She has no wheezes.  GI: Soft. There is no tenderness. There is no guarding.  Musculoskeletal:       Right knee: She exhibits decreased range of motion, swelling, laceration (healed previous incision) and bony tenderness. She exhibits no ecchymosis, no deformity and no erythema. Tenderness found.  Lymphadenopathy:    She has no cervical adenopathy.  Neurological: She is alert and oriented to person, place, and time.  Skin: Skin is warm and dry.  Psychiatric: She has a normal mood and affect.      Labs:  Estimated body mass index is 24.55 kg/m as calculated from the following:   Height as of 02/19/17:  (1.6 m).   Weight as of 02/19/17: 62.9 kg (138 lb 9.6 oz).  Imaging Review Plain radiographs demonstrate previous TKA of the right knee(s). The overall alignment is neutral.The bone quality appears to be good for age and reported activity level.   Assessment/Plan:  Right knee with failed previous arthroplasty.   The patient history, physical examination, clinical judgment of the provider and imaging studies are consistent with failure of the right knee, previous total knee arthroplasty. Revision total knee arthroplasty is deemed medically necessary. The treatment options including medical management, injection therapy, arthroscopy and revision arthroplasty were discussed at length. The risks and benefits of revision total knee arthroplasty were presented and reviewed. The risks due to aseptic loosening, infection, stiffness, patella tracking problems, thromboembolic  complications and other imponderables were discussed. The patient acknowledged the explanation, agreed to proceed with the plan and consent was signed. Patient is being admitted for inpatient treatment for surgery, pain control, PT, OT, prophylactic antibiotics, VTE prophylaxis, progressive ambulation and ADL's and discharge planning.The patient is planning to be discharged home.      Anastasio Auerbach Loyalty Arentz   PA-C  04/04/2017, 1:00 PM

## 2017-04-12 ENCOUNTER — Other Ambulatory Visit (HOSPITAL_COMMUNITY): Payer: Self-pay | Admitting: Emergency Medicine

## 2017-04-12 NOTE — Patient Instructions (Signed)
Kari Flores  04/12/2017   Your procedure is scheduled on: 04-22-17  Report to [facility] Main  Entrance    Follow signs to Short Stay on first floor at 515AM   Call this number if you have problems the morning of surgery  [phone]   Remember: ONLY 1 PERSON MAY GO WITH YOU TO SHORT STAY TO GET  READY MORNING OF YOUR SURGERY.  Do not eat food or drink liquids :After Midnight.      Take these medicines the morning of surgery with A SIP OF WATER: tylenol as needed, metoprolol                                You may not have any metal on your body including hair pins and              piercings  Do not wear jewelry, make-up, lotions, powders or perfumes, deodorant             Do not wear nail polish.  Do not shave  48 hours prior to surgery.                Do not bring valuables to the hospital. [facility] IS NOT             RESPONSIBLE   FOR VALUABLES.  Contacts, dentures or bridgework may not be worn into surgery.  Leave suitcase in the car. After surgery it may be brought to your room.                Please read over the following fact sheets you were given: _____________________________________________________________________             [facility] - Preparing for Surgery Before surgery, you can play an important role.  Because skin is not sterile, your skin needs to be as free of germs as possible.  You can reduce the number of germs on your skin by washing with CHG (chlorahexidine gluconate) soap before surgery.  CHG is an antiseptic cleaner which kills germs and bonds with the skin to continue killing germs even after washing. Please DO NOT use if you have an allergy to CHG or antibacterial soaps.  If your skin becomes reddened/irritated stop using the CHG and inform your nurse when you arrive at Short Stay. Do not shave (including legs and underarms) for at least 48 hours prior to the first CHG shower.  You may shave your face/neck. Please  follow these instructions carefully:  1.  Shower with CHG Soap the night before surgery and the  morning of Surgery.  2.  If you choose to wash your hair, wash your hair first as usual with your  normal  shampoo.  3.  After you shampoo, rinse your hair and body thoroughly to remove the  shampoo.                           4.  Use CHG as you would any other liquid soap.  You can apply chg directly  to the skin and wash                       Gently with a scrungie or clean washcloth.  5.  Apply the CHG Soap to your body  ONLY FROM THE NECK DOWN.   Do not use on face/ open                           Wound or open sores. Avoid contact with eyes, ears mouth and genitals (private parts).                       Wash face,  Genitals (private parts) with your normal soap.             6.  Wash thoroughly, paying special attention to the area where your surgery  will be performed.  7.  Thoroughly rinse your body with warm water from the neck down.  8.  DO NOT shower/wash with your normal soap after using and rinsing off  the CHG Soap.                9.  Pat yourself dry with a clean towel.            10.  Wear clean pajamas.            11.  Place clean sheets on your bed the night of your first shower and do not  sleep with pets. Day of Surgery : Do not apply any lotions/deodorants the morning of surgery.  Please wear clean clothes to the hospital/surgery center.  FAILURE TO FOLLOW THESE INSTRUCTIONS MAY RESULT IN THE CANCELLATION OF YOUR SURGERY PATIENT SIGNATURE_________________________________  NURSE SIGNATURE__________________________________  ________________________________________________________________________   Kari Flores  An incentive spirometer is a tool that can help keep your lungs clear and active. This tool measures how well you are filling your lungs with each breath. Taking long deep breaths may help reverse or decrease the chance of developing breathing (pulmonary) problems  (especially infection) following:  A long period of time when you are unable to move or be active. BEFORE THE PROCEDURE   If the spirometer includes an indicator to show your best effort, your nurse or respiratory therapist will set it to a desired goal.  If possible, sit up straight or lean slightly forward. Try not to slouch.  Hold the incentive spirometer in an upright position. INSTRUCTIONS FOR USE  1. Sit on the edge of your bed if possible, or sit up as far as you can in bed or on a chair. 2. Hold the incentive spirometer in an upright position. 3. Breathe out normally. 4. Place the mouthpiece in your mouth and seal your lips tightly around it. 5. Breathe in slowly and as deeply as possible, raising the piston or the ball toward the top of the column. 6. Hold your breath for 3-5 seconds or for as long as possible. Allow the piston or ball to fall to the bottom of the column. 7. Remove the mouthpiece from your mouth and breathe out normally. 8. Rest for a few seconds and repeat Steps 1 through 7 at least 10 times every 1-2 hours when you are awake. Take your time and take a few normal breaths between deep breaths. 9. The spirometer may include an indicator to show your best effort. Use the indicator as a goal to work toward during each repetition. 10. After each set of 10 deep breaths, practice coughing to be sure your lungs are clear. If you have an incision (the cut made at the time of surgery), support your incision when coughing by placing a pillow or rolled up towels firmly against it. Once  you are able to get out of bed, walk around indoors and cough well. You may stop using the incentive spirometer when instructed by your caregiver.  RISKS AND COMPLICATIONS  Take your time so you do not get dizzy or light-headed.  If you are in pain, you may need to take or ask for pain medication before doing incentive spirometry. It is harder to take a deep breath if you are having  pain. AFTER USE  Rest and breathe slowly and easily.  It can be helpful to keep track of a log of your progress. Your caregiver can provide you with a simple table to help with this. If you are using the spirometer at home, follow these instructions: [place] IF:   You are having difficultly using the spirometer.  You have trouble using the spirometer as often as instructed.  Your pain medication is not giving enough relief while using the spirometer.  You develop fever of 100.5 F (38.1 C) or higher. SEEK IMMEDIATE MEDICAL CARE IF:   You cough up bloody sputum that had not been present before.  You develop fever of 102 F (38.9 C) or greater.  You develop worsening pain at or near the incision site. MAKE SURE YOU:   Understand these instructions.  Will watch your condition.  Will get help right away if you are not doing well or get worse. Document Released: 11/05/2006 Document Revised: 09/17/2011 Document Reviewed: 01/06/2007 Exit[employer] Patient Information 2014 Exit[employer], [place].   ________________________________________________________________________  WHAT IS A BLOOD TRANSFUSION? Blood Transfusion Information  A transfusion is the replacement of blood or some of its parts. Blood is made up of multiple cells which provide different functions.  Red blood cells carry oxygen and are used for blood loss replacement.  White blood cells fight against infection.  Platelets control bleeding.  Plasma helps clot blood.  Other blood products are available for specialized needs, such as hemophilia or other clotting disorders. BEFORE THE TRANSFUSION  Who gives blood for transfusions?   Healthy volunteers who are fully evaluated to make sure their blood is safe. This is blood bank blood. Transfusion therapy is the safest it has ever been in the practice of medicine. Before blood is taken from a donor, a complete history is taken to make sure that person has no history  of diseases nor engages in risky social behavior (examples are intravenous drug use or sexual activity with multiple partners). The donor's travel history is screened to minimize risk of transmitting infections, such as malaria. The donated blood is tested for signs of infectious diseases, such as HIV and hepatitis. The blood is then tested to be sure it is compatible with you in order to minimize the chance of a transfusion reaction. If you or a relative donates blood, this is often done in anticipation of surgery and is not appropriate for emergency situations. It takes many days to process the donated blood. RISKS AND COMPLICATIONS Although transfusion therapy is very safe and saves many lives, the main dangers of transfusion include:   Getting an infectious disease.  Developing a transfusion reaction. This is an allergic reaction to something in the blood you were given. Every precaution is taken to prevent this. The decision to have a blood transfusion has been considered carefully by your caregiver before blood is given. Blood is not given unless the benefits outweigh the risks. AFTER THE TRANSFUSION  Right after receiving a blood transfusion, you will usually feel much better and more energetic. This  is especially true if your red blood cells have gotten low (anemic). The transfusion raises the level of the red blood cells which carry oxygen, and this usually causes an energy increase.  The nurse administering the transfusion will monitor you carefully for complications. HOME CARE INSTRUCTIONS  No special instructions are needed after a transfusion. You may find your energy is better. Speak with your caregiver about any limitations on activity for underlying diseases you may have. SEEK MEDICAL CARE IF:   Your condition is not improving after your transfusion.  You develop redness or irritation at the intravenous (IV) site. SEEK IMMEDIATE MEDICAL CARE IF:  Any of the following symptoms  occur over the next 12 hours:  Shaking chills.  You have a temperature by mouth above 102 F (38.9 C), not controlled by medicine.  Chest, back, or muscle pain.  People around you feel you are not acting correctly or are confused.  Shortness of breath or difficulty breathing.  Dizziness and fainting.  You get a rash or develop hives.  You have a decrease in urine output.  Your urine turns a dark color or changes to pink, red, or brown. Any of the following symptoms occur over the next 10 days:  You have a temperature by mouth above 102 F (38.9 C), not controlled by medicine.  Shortness of breath.  Weakness after normal activity.  The white part of the eye turns yellow (jaundice).  You have a decrease in the amount of urine or are urinating less often.  Your urine turns a dark color or changes to pink, red, or brown. Document Released: 06/22/2000 Document Revised: 09/17/2011 Document Reviewed: 02/09/2008 [facility] Patient Information 2014 [place], [place].  _______________________________________________________________________

## 2017-04-12 NOTE — Progress Notes (Signed)
LOV/ cardiology clearance Dr Ginette Otto epic care  on everywhere  12-18-16  Surgery clearance Juanetta Snow NP on chart , epic care everywhere 12-24-16  EKG 12-18-16 on chart, epic care everywhere   ECHO 02-07-15 epic care everywhere

## 2017-04-15 ENCOUNTER — Encounter (HOSPITAL_COMMUNITY): Payer: Self-pay

## 2017-04-15 ENCOUNTER — Encounter (INDEPENDENT_AMBULATORY_CARE_PROVIDER_SITE_OTHER): Payer: Self-pay

## 2017-04-15 ENCOUNTER — Encounter (HOSPITAL_COMMUNITY)
Admission: RE | Admit: 2017-04-15 | Discharge: 2017-04-15 | Disposition: A | Discharge status: Home / Self Care | Payer: Medicare Other | Source: Ambulatory Visit | Attending: Orthopedic Surgery | Admitting: Orthopedic Surgery

## 2017-04-15 DIAGNOSIS — M25561 Pain in right knee: Secondary | ICD-10-CM | POA: Insufficient documentation

## 2017-04-15 DIAGNOSIS — Z79899 Other long term (current) drug therapy: Secondary | ICD-10-CM | POA: Insufficient documentation

## 2017-04-15 DIAGNOSIS — Z01818 Encounter for other preprocedural examination: Secondary | ICD-10-CM | POA: Insufficient documentation

## 2017-04-15 DIAGNOSIS — I10 Essential (primary) hypertension: Secondary | ICD-10-CM | POA: Insufficient documentation

## 2017-04-15 DIAGNOSIS — Z96651 Presence of right artificial knee joint: Secondary | ICD-10-CM | POA: Insufficient documentation

## 2017-04-15 DIAGNOSIS — K219 Gastro-esophageal reflux disease without esophagitis: Secondary | ICD-10-CM | POA: Diagnosis not present

## 2017-04-15 LAB — CBC
HEMATOCRIT: 42.8 % (ref 36.0–46.0)
HEMOGLOBIN: 13.9 g/dL (ref 12.0–15.0)
MCH: 29.5 pg (ref 26.0–34.0)
MCHC: 32.5 g/dL (ref 30.0–36.0)
MCV: 90.9 fL (ref 78.0–100.0)
Platelets: 193 10*3/uL (ref 150–400)
RBC: 4.71 MIL/uL (ref 3.87–5.11)
RDW: 15.4 % (ref 11.5–15.5)
WBC: 7.4 10*3/uL (ref 4.0–10.5)

## 2017-04-15 LAB — BASIC METABOLIC PANEL
ANION GAP: 8 (ref 5–15)
BUN: 10 mg/dL (ref 6–20)
CO2: 27 mmol/L (ref 22–32)
Calcium: 8.9 mg/dL (ref 8.9–10.3)
Chloride: 103 mmol/L (ref 101–111)
Creatinine, Ser: 0.58 mg/dL (ref 0.44–1.00)
GFR calc Af Amer: >60 mL/min (ref >60)
GFR calc non Af Amer: >60 mL/min (ref >60)
GLUCOSE: 90 mg/dL (ref 65–99)
POTASSIUM: 4.3 mmol/L (ref 3.5–5.1)
Sodium: 138 mmol/L (ref 135–145)

## 2017-04-15 LAB — SURGICAL PCR SCREEN
MRSA, PCR: NEGATIVE
STAPHYLOCOCCUS AUREUS: NEGATIVE

## 2017-04-15 LAB — HEMOGLOBIN A1C
Hgb A1c MFr Bld: 5.8 % — ABNORMAL HIGH (ref 4.8–5.6)
MEAN PLASMA GLUCOSE: 119.76 mg/dL

## 2017-04-21 NOTE — Anesthesia Preprocedure Evaluation (Addendum)
Anesthesia Evaluation  Patient identified by MRN, date of birth, ID band Patient awake    Reviewed: Allergy & Precautions, H&P , NPO status , Patient's Chart, lab work & pertinent test results, reviewed documented beta blocker date and time   Airway Mallampati: II  TM Distance: >3 FB Neck ROM: full    Dental  (+) Teeth Intact, Dental Advisory Given   Pulmonary neg pulmonary ROS,    Pulmonary exam normal breath sounds clear to auscultation       Cardiovascular hypertension, Pt. on medications + DVT   Rhythm:regular Rate:Normal     Neuro/Psych CVA, No Residual Symptoms negative psych ROS   GI/Hepatic Neg liver ROS, GERD  Controlled,  Endo/Other  negative endocrine ROS  Renal/GU negative Renal ROS  negative genitourinary   Musculoskeletal  (+) Arthritis ,   Abdominal   Peds  Hematology negative hematology ROS (+)   Anesthesia Other Findings   Reproductive/Obstetrics                            Anesthesia Physical  Anesthesia Plan  ASA: II  Anesthesia Plan: Spinal   Post-op Pain Management:  Regional for Post-op pain   Induction:   PONV Risk Score and Plan: Treatment may vary due to age or medical condition and Propofol infusion  Airway Management Planned: Natural Airway and Nasal Cannula  Additional Equipment: None  Intra-op Plan:   Post-operative Plan:   Informed Consent: I have reviewed the patients History and Physical, chart, labs and discussed the procedure including the risks, benefits and alternatives for the proposed anesthesia with the patient or authorized representative who has indicated his/her understanding and acceptance.   Dental Advisory Given  Plan Discussed with: CRNA  Anesthesia Plan Comments: (Plan to use 0.5% isobaric bupivacaine for spinal )        Anesthesia Quick Evaluation

## 2017-04-22 ENCOUNTER — Encounter (HOSPITAL_COMMUNITY): Payer: Self-pay

## 2017-04-22 ENCOUNTER — Inpatient Hospital Stay (HOSPITAL_COMMUNITY): Payer: Medicare Other | Admitting: Anesthesiology

## 2017-04-22 ENCOUNTER — Inpatient Hospital Stay (HOSPITAL_COMMUNITY)
Admission: RE | Admit: 2017-04-22 | Discharge: 2017-04-24 | DRG: 465 | Disposition: A | Discharge status: Home / Self Care | Payer: Medicare Other | Source: Ambulatory Visit | Attending: Orthopedic Surgery | Admitting: Orthopedic Surgery

## 2017-04-22 ENCOUNTER — Encounter (HOSPITAL_COMMUNITY)
Admission: RE | Disposition: A | Discharge status: Home / Self Care | Payer: Self-pay | Source: Ambulatory Visit | Attending: Orthopedic Surgery

## 2017-04-22 DIAGNOSIS — Z96651 Presence of right artificial knee joint: Secondary | ICD-10-CM

## 2017-04-22 DIAGNOSIS — Z96641 Presence of right artificial hip joint: Secondary | ICD-10-CM | POA: Diagnosis present

## 2017-04-22 DIAGNOSIS — Z7902 Long term (current) use of antithrombotics/antiplatelets: Secondary | ICD-10-CM

## 2017-04-22 DIAGNOSIS — Y792 Prosthetic and other implants, materials and accessory orthopedic devices associated with adverse incidents: Secondary | ICD-10-CM | POA: Diagnosis present

## 2017-04-22 DIAGNOSIS — Z8673 Personal history of transient ischemic attack (TIA), and cerebral infarction without residual deficits: Secondary | ICD-10-CM | POA: Diagnosis not present

## 2017-04-22 DIAGNOSIS — I10 Essential (primary) hypertension: Secondary | ICD-10-CM | POA: Diagnosis present

## 2017-04-22 DIAGNOSIS — M1711 Unilateral primary osteoarthritis, right knee: Secondary | ICD-10-CM | POA: Diagnosis present

## 2017-04-22 DIAGNOSIS — T84092A Other mechanical complication of internal right knee prosthesis, initial encounter: Secondary | ICD-10-CM | POA: Diagnosis present

## 2017-04-22 DIAGNOSIS — Z79891 Long term (current) use of opiate analgesic: Secondary | ICD-10-CM | POA: Diagnosis not present

## 2017-04-22 DIAGNOSIS — Z86718 Personal history of other venous thrombosis and embolism: Secondary | ICD-10-CM | POA: Diagnosis not present

## 2017-04-22 DIAGNOSIS — K219 Gastro-esophageal reflux disease without esophagitis: Secondary | ICD-10-CM | POA: Diagnosis present

## 2017-04-22 DIAGNOSIS — T8484XA Pain due to internal orthopedic prosthetic devices, implants and grafts, initial encounter: Secondary | ICD-10-CM | POA: Diagnosis present

## 2017-04-22 HISTORY — PX: TOTAL KNEE REVISION: SHX996

## 2017-04-22 LAB — TYPE AND SCREEN
ABO/RH(D): O POS
Antibody Screen: NEGATIVE

## 2017-04-22 SURGERY — TOTAL KNEE REVISION
Anesthesia: Spinal | Site: Knee | Laterality: Right

## 2017-04-22 MED ORDER — TRANEXAMIC ACID 1000 MG/10ML IV SOLN
2000.0000 mg | Freq: Once | INTRAVENOUS | Status: DC
  Filled 2017-04-22: qty 20

## 2017-04-22 MED ORDER — DEXAMETHASONE SODIUM PHOSPHATE 10 MG/ML IJ SOLN
INTRAMUSCULAR | Status: DC | PRN
  Administered 2017-04-22: 10 mg via INTRAVENOUS

## 2017-04-22 MED ORDER — HYDROMORPHONE HCL-NACL 0.5-0.9 MG/ML-% IV SOSY: 0.5000 mg | PREFILLED_SYRINGE | INTRAVENOUS | Status: DC | PRN

## 2017-04-22 MED ORDER — HYDROCODONE-ACETAMINOPHEN 7.5-325 MG PO TABS: 1.0000 | ORAL_TABLET | ORAL | 0 refills | Status: AC | PRN

## 2017-04-22 MED ORDER — DEXAMETHASONE SODIUM PHOSPHATE 10 MG/ML IJ SOLN: 10.0000 mg | Freq: Once | INTRAMUSCULAR | Status: DC

## 2017-04-22 MED ORDER — METHOCARBAMOL 1000 MG/10ML IJ SOLN
500.0000 mg | Freq: Four times a day (QID) | INTRAVENOUS | Status: DC | PRN
  Administered 2017-04-22: 500 mg via INTRAVENOUS
  Filled 2017-04-22: qty 550

## 2017-04-22 MED ORDER — MENTHOL 3 MG MT LOZG: 1.0000 | LOZENGE | OROMUCOSAL | Status: DC | PRN

## 2017-04-22 MED ORDER — SODIUM CHLORIDE 0.9 % IJ SOLN
INTRAMUSCULAR | Status: DC | PRN
  Administered 2017-04-22: 30 mL

## 2017-04-22 MED ORDER — CHLORHEXIDINE GLUCONATE 4 % EX LIQD: 60.0000 mL | Freq: Once | CUTANEOUS | Status: DC

## 2017-04-22 MED ORDER — SODIUM CHLORIDE 0.9 % IR SOLN
Status: DC | PRN
  Administered 2017-04-22: 3000 mL

## 2017-04-22 MED ORDER — CEFAZOLIN SODIUM-DEXTROSE 2-4 GM/100ML-% IV SOLN
2.0000 g | INTRAVENOUS | Status: AC
  Administered 2017-04-22: 2 g via INTRAVENOUS

## 2017-04-22 MED ORDER — CEFAZOLIN SODIUM-DEXTROSE 2-4 GM/100ML-% IV SOLN
INTRAVENOUS | Status: AC
  Filled 2017-04-22: qty 100

## 2017-04-22 MED ORDER — DIPHENHYDRAMINE HCL 25 MG PO CAPS: 25.0000 mg | ORAL_CAPSULE | Freq: Four times a day (QID) | ORAL | Status: DC | PRN

## 2017-04-22 MED ORDER — ONDANSETRON HCL 4 MG/2ML IJ SOLN: 4.0000 mg | Freq: Once | INTRAMUSCULAR | Status: DC | PRN

## 2017-04-22 MED ORDER — MAGNESIUM CITRATE PO SOLN: 1.0000 | Freq: Once | ORAL | Status: DC | PRN

## 2017-04-22 MED ORDER — PHENYLEPHRINE HCL 10 MG/ML IJ SOLN
INTRAVENOUS | Status: DC | PRN
  Administered 2017-04-22: 35 ug/min via INTRAVENOUS

## 2017-04-22 MED ORDER — METOPROLOL SUCCINATE ER 50 MG PO TB24: 50.0000 mg | ORAL_TABLET | ORAL | Status: DC

## 2017-04-22 MED ORDER — MIDAZOLAM HCL 2 MG/2ML IJ SOLN
INTRAMUSCULAR | Status: AC
  Filled 2017-04-22: qty 2

## 2017-04-22 MED ORDER — POLYETHYLENE GLYCOL 3350 17 G PO PACK: 17.0000 g | PACK | Freq: Two times a day (BID) | ORAL | 0 refills | Status: AC

## 2017-04-22 MED ORDER — SODIUM CHLORIDE 0.9 % IV SOLN
INTRAVENOUS | Status: DC
  Administered 2017-04-22 – 2017-04-23 (×2): via INTRAVENOUS

## 2017-04-22 MED ORDER — POLYETHYLENE GLYCOL 3350 17 G PO PACK
17.0000 g | PACK | Freq: Two times a day (BID) | ORAL | Status: DC
Start: 2017-04-22 — End: 2017-04-24
  Administered 2017-04-22 – 2017-04-24 (×4): 17 g via ORAL
  Filled 2017-04-22 (×4): qty 1

## 2017-04-22 MED ORDER — MIDAZOLAM HCL 5 MG/5ML IJ SOLN
INTRAMUSCULAR | Status: DC | PRN
  Administered 2017-04-22 (×2): 1 mg via INTRAVENOUS

## 2017-04-22 MED ORDER — ATORVASTATIN CALCIUM 20 MG PO TABS
20.0000 mg | ORAL_TABLET | Freq: Every day | ORAL | Status: DC
  Administered 2017-04-22 – 2017-04-23 (×2): 20 mg via ORAL
  Filled 2017-04-22 (×2): qty 1

## 2017-04-22 MED ORDER — PROPOFOL 10 MG/ML IV BOLUS
INTRAVENOUS | Status: AC
  Filled 2017-04-22: qty 20

## 2017-04-22 MED ORDER — ONDANSETRON HCL 4 MG PO TABS
4.0000 mg | ORAL_TABLET | Freq: Four times a day (QID) | ORAL | Status: DC | PRN
  Filled 2017-04-22: qty 1

## 2017-04-22 MED ORDER — ONDANSETRON HCL 4 MG/2ML IJ SOLN: 4.0000 mg | Freq: Four times a day (QID) | INTRAMUSCULAR | Status: DC | PRN

## 2017-04-22 MED ORDER — KETOROLAC TROMETHAMINE 30 MG/ML IJ SOLN
INTRAMUSCULAR | Status: DC | PRN
  Administered 2017-04-22: 30 mg

## 2017-04-22 MED ORDER — POTASSIUM CHLORIDE CRYS ER 10 MEQ PO TBCR
10.0000 meq | EXTENDED_RELEASE_TABLET | Freq: Two times a day (BID) | ORAL | Status: DC
  Administered 2017-04-22 – 2017-04-24 (×4): 10 meq via ORAL
  Filled 2017-04-22 (×4): qty 1

## 2017-04-22 MED ORDER — METHOCARBAMOL 500 MG PO TABS: 500.0000 mg | ORAL_TABLET | Freq: Four times a day (QID) | ORAL | 0 refills | Status: AC | PRN

## 2017-04-22 MED ORDER — ALUM & MAG HYDROXIDE-SIMETH 200-200-20 MG/5ML PO SUSP: 15.0000 mL | ORAL | Status: DC | PRN

## 2017-04-22 MED ORDER — ONDANSETRON HCL 4 MG/2ML IJ SOLN
INTRAMUSCULAR | Status: DC | PRN
  Administered 2017-04-22: 4 mg via INTRAVENOUS

## 2017-04-22 MED ORDER — FERROUS SULFATE 325 (65 FE) MG PO TABS: 325.0000 mg | ORAL_TABLET | Freq: Three times a day (TID) | ORAL | 3 refills | Status: AC

## 2017-04-22 MED ORDER — FENTANYL CITRATE (PF) 100 MCG/2ML IJ SOLN
INTRAMUSCULAR | Status: DC | PRN
  Administered 2017-04-22 (×2): 50 ug via INTRAVENOUS

## 2017-04-22 MED ORDER — STERILE WATER FOR IRRIGATION IR SOLN
Status: DC | PRN
  Administered 2017-04-22: 2000 mL

## 2017-04-22 MED ORDER — BUPIVACAINE-EPINEPHRINE (PF) 0.5% -1:200000 IJ SOLN
INTRAMUSCULAR | Status: DC | PRN
  Administered 2017-04-22: 30 mL via PERINEURAL

## 2017-04-22 MED ORDER — CELECOXIB 200 MG PO CAPS
200.0000 mg | ORAL_CAPSULE | Freq: Two times a day (BID) | ORAL | Status: DC
  Administered 2017-04-22 – 2017-04-24 (×4): 200 mg via ORAL
  Filled 2017-04-22 (×4): qty 1

## 2017-04-22 MED ORDER — DOCUSATE SODIUM 100 MG PO CAPS: 100.0000 mg | ORAL_CAPSULE | Freq: Two times a day (BID) | ORAL | 0 refills | Status: AC

## 2017-04-22 MED ORDER — CEFAZOLIN SODIUM-DEXTROSE 2-4 GM/100ML-% IV SOLN
2.0000 g | Freq: Four times a day (QID) | INTRAVENOUS | Status: AC
  Administered 2017-04-22 (×2): 2 g via INTRAVENOUS
  Filled 2017-04-22 (×2): qty 100

## 2017-04-22 MED ORDER — TRANEXAMIC ACID 1000 MG/10ML IV SOLN
INTRAVENOUS | Status: AC | PRN
  Administered 2017-04-22: 2000 mg via TOPICAL

## 2017-04-22 MED ORDER — METOCLOPRAMIDE HCL 5 MG PO TABS: 5.0000 mg | ORAL_TABLET | Freq: Three times a day (TID) | ORAL | Status: DC | PRN

## 2017-04-22 MED ORDER — METOPROLOL SUCCINATE ER 50 MG PO TB24
50.0000 mg | ORAL_TABLET | Freq: Every day | ORAL | Status: DC
  Administered 2017-04-22: 23:00:00 50 mg via ORAL
  Filled 2017-04-22 (×2): qty 1

## 2017-04-22 MED ORDER — ASPIRIN-DIPYRIDAMOLE ER 25-200 MG PO CP12
1.0000 | ORAL_CAPSULE | Freq: Two times a day (BID) | ORAL | Status: DC
  Administered 2017-04-23 – 2017-04-24 (×3): 1 via ORAL
  Filled 2017-04-22 (×4): qty 1

## 2017-04-22 MED ORDER — BUPIVACAINE HCL (PF) 0.5 % IJ SOLN
INTRAMUSCULAR | Status: DC | PRN
  Administered 2017-04-22: 2.2 mL via INTRATHECAL

## 2017-04-22 MED ORDER — DEXAMETHASONE SODIUM PHOSPHATE 10 MG/ML IJ SOLN
INTRAMUSCULAR | Status: AC
  Filled 2017-04-22: qty 1

## 2017-04-22 MED ORDER — FENTANYL CITRATE (PF) 100 MCG/2ML IJ SOLN
INTRAMUSCULAR | Status: AC
  Filled 2017-04-22: qty 2

## 2017-04-22 MED ORDER — FERROUS SULFATE 325 (65 FE) MG PO TABS
325.0000 mg | ORAL_TABLET | Freq: Three times a day (TID) | ORAL | Status: DC
  Administered 2017-04-23 – 2017-04-24 (×3): 325 mg via ORAL
  Filled 2017-04-22 (×4): qty 1

## 2017-04-22 MED ORDER — DEXAMETHASONE SODIUM PHOSPHATE 10 MG/ML IJ SOLN
10.0000 mg | Freq: Once | INTRAMUSCULAR | Status: AC
  Administered 2017-04-23: 11:00:00 10 mg via INTRAVENOUS
  Filled 2017-04-22: qty 1

## 2017-04-22 MED ORDER — METOPROLOL SUCCINATE ER 50 MG PO TB24
100.0000 mg | ORAL_TABLET | Freq: Every morning | ORAL | Status: DC
  Administered 2017-04-23 – 2017-04-24 (×2): 100 mg via ORAL
  Filled 2017-04-22 (×2): qty 2

## 2017-04-22 MED ORDER — 0.9 % SODIUM CHLORIDE (POUR BTL) OPTIME
TOPICAL | Status: DC | PRN
  Administered 2017-04-22: 1000 mL

## 2017-04-22 MED ORDER — METOCLOPRAMIDE HCL 5 MG/ML IJ SOLN: 5.0000 mg | Freq: Three times a day (TID) | INTRAMUSCULAR | Status: DC | PRN

## 2017-04-22 MED ORDER — METHOCARBAMOL 500 MG PO TABS: 500.0000 mg | ORAL_TABLET | Freq: Four times a day (QID) | ORAL | Status: DC | PRN

## 2017-04-22 MED ORDER — BUPIVACAINE-EPINEPHRINE 0.25% -1:200000 IJ SOLN
INTRAMUSCULAR | Status: DC | PRN
  Administered 2017-04-22: 15 mL

## 2017-04-22 MED ORDER — AMLODIPINE BESYLATE 5 MG PO TABS
2.5000 mg | ORAL_TABLET | Freq: Every evening | ORAL | Status: DC
  Administered 2017-04-22: 2.5 mg via ORAL
  Filled 2017-04-22 (×2): qty 1

## 2017-04-22 MED ORDER — BISACODYL 10 MG RE SUPP: 10.0000 mg | Freq: Every day | RECTAL | Status: DC | PRN

## 2017-04-22 MED ORDER — DOCUSATE SODIUM 100 MG PO CAPS
100.0000 mg | ORAL_CAPSULE | Freq: Two times a day (BID) | ORAL | Status: DC
  Administered 2017-04-22 – 2017-04-24 (×4): 100 mg via ORAL
  Filled 2017-04-22 (×4): qty 1

## 2017-04-22 MED ORDER — PHENOL 1.4 % MT LIQD
1.0000 | OROMUCOSAL | Status: DC | PRN
  Filled 2017-04-22: qty 177

## 2017-04-22 MED ORDER — PROPOFOL 500 MG/50ML IV EMUL
INTRAVENOUS | Status: DC | PRN
  Administered 2017-04-22: 75 ug/kg/min via INTRAVENOUS

## 2017-04-22 MED ORDER — FENTANYL CITRATE (PF) 100 MCG/2ML IJ SOLN: 25.0000 ug | INTRAMUSCULAR | Status: DC | PRN

## 2017-04-22 MED ORDER — SODIUM CHLORIDE 0.9 % IJ SOLN
INTRAMUSCULAR | Status: AC
  Filled 2017-04-22: qty 50

## 2017-04-22 MED ORDER — HYDROCHLOROTHIAZIDE 25 MG PO TABS
12.5000 mg | ORAL_TABLET | Freq: Every morning | ORAL | Status: DC
  Administered 2017-04-23 – 2017-04-24 (×2): 12.5 mg via ORAL
  Filled 2017-04-22 (×2): qty 1

## 2017-04-22 MED ORDER — PROPOFOL 10 MG/ML IV BOLUS
INTRAVENOUS | Status: DC | PRN
  Administered 2017-04-22 (×3): 20 mg via INTRAVENOUS

## 2017-04-22 MED ORDER — EPHEDRINE 5 MG/ML INJ
INTRAVENOUS | Status: AC
  Filled 2017-04-22: qty 10

## 2017-04-22 MED ORDER — BUPIVACAINE-EPINEPHRINE (PF) 0.25% -1:200000 IJ SOLN
INTRAMUSCULAR | Status: AC
  Filled 2017-04-22: qty 30

## 2017-04-22 MED ORDER — ONDANSETRON HCL 4 MG/2ML IJ SOLN
INTRAMUSCULAR | Status: AC
  Filled 2017-04-22: qty 2

## 2017-04-22 MED ORDER — KETOROLAC TROMETHAMINE 30 MG/ML IJ SOLN
INTRAMUSCULAR | Status: AC
  Filled 2017-04-22: qty 1

## 2017-04-22 MED ORDER — HYDROCODONE-ACETAMINOPHEN 7.5-325 MG PO TABS
1.0000 | ORAL_TABLET | ORAL | Status: DC
  Administered 2017-04-22 – 2017-04-24 (×10): 2 via ORAL
  Filled 2017-04-22 (×5): qty 2
  Filled 2017-04-22: qty 1
  Filled 2017-04-22 (×6): qty 2

## 2017-04-22 MED ORDER — EPHEDRINE SULFATE-NACL 50-0.9 MG/10ML-% IV SOSY
PREFILLED_SYRINGE | INTRAVENOUS | Status: DC | PRN
  Administered 2017-04-22: 10 mg via INTRAVENOUS

## 2017-04-22 MED ORDER — LACTATED RINGERS IV SOLN
INTRAVENOUS | Status: DC | PRN
  Administered 2017-04-22 (×3): via INTRAVENOUS

## 2017-04-22 MED ORDER — PHENYLEPHRINE HCL 10 MG/ML IJ SOLN
INTRAMUSCULAR | Status: AC
  Filled 2017-04-22: qty 1

## 2017-04-22 SURGICAL SUPPLY — 59 items
ADH SKN CLS APL DERMABOND .7 (GAUZE/BANDAGES/DRESSINGS) ×1
AUG FEM SZ3 10 TL STAB DIST RT (Joint) ×1 IMPLANT
AUG FEM SZ3 15 TL STAB DIST RT ×1 IMPLANT
BAG DECANTER FOR FLEXI CONT (MISCELLANEOUS) ×2 IMPLANT
BAG SPEC THK2 15X12 ZIP CLS (MISCELLANEOUS) ×1
BAG ZIPLOCK 12X15 (MISCELLANEOUS) ×2 IMPLANT
BANDAGE ACE 6X5 VEL STRL LF (GAUZE/BANDAGES/DRESSINGS) ×3 IMPLANT
BLADE SAW SGTL 11.0X1.19X90.0M (BLADE) ×2 IMPLANT
BLADE SAW SGTL 13.0X1.19X90.0M (BLADE) ×3 IMPLANT
BLADE SAW SGTL 81X20 HD (BLADE) ×3 IMPLANT
BONE CEMENT GENTAMICIN (Cement) ×9 IMPLANT
BRUSH FEMORAL CANAL (MISCELLANEOUS) IMPLANT
CEMENT BONE GENTAMICIN 40 (Cement) IMPLANT
CLOTH BEACON ORANGE TIMEOUT ST (SAFETY) ×3 IMPLANT
COMPONENT TRIATHLON FEMORAL RT IMPLANT
COVER SURGICAL LIGHT HANDLE (MISCELLANEOUS) ×3 IMPLANT
CUFF TOURN SGL QUICK 34 (TOURNIQUET CUFF) ×3
CUFF TRNQT CYL 34X4X40X1 (TOURNIQUET CUFF) ×1 IMPLANT
DERMABOND ADVANCED (GAUZE/BANDAGES/DRESSINGS) ×2
DERMABOND ADVANCED .7 DNX12 (GAUZE/BANDAGES/DRESSINGS) ×1 IMPLANT
DRAPE U-SHAPE 47X51 STRL (DRAPES) ×3 IMPLANT
DRSG AQUACEL AG ADV 3.5X14 (GAUZE/BANDAGES/DRESSINGS) ×2 IMPLANT
DURAPREP 26ML APPLICATOR (WOUND CARE) ×6 IMPLANT
ELECT REM PT RETURN 15FT ADLT (MISCELLANEOUS) ×3 IMPLANT
FEMORAL CAMP STABILIZER KNEE (Knees) ×2 IMPLANT
GLOVE BIO SURGEON STRL SZ7.5 (GLOVE) ×2 IMPLANT
GLOVE BIOGEL PI IND STRL 7.5 (GLOVE) ×1 IMPLANT
GLOVE BIOGEL PI IND STRL 8.5 (GLOVE) ×1 IMPLANT
GLOVE BIOGEL PI INDICATOR 7.5 (GLOVE) ×14
GLOVE BIOGEL PI INDICATOR 8.5 (GLOVE) ×2
GLOVE ECLIPSE 8.0 STRL XLNG CF (GLOVE) ×3 IMPLANT
GLOVE ORTHO TXT STRL SZ7.5 (GLOVE) ×6 IMPLANT
GOWN STRL REUS W/TWL LRG LVL3 (GOWN DISPOSABLE) ×3 IMPLANT
GOWN STRL REUS W/TWL XL LVL3 (GOWN DISPOSABLE) ×7 IMPLANT
HANDPIECE INTERPULSE COAX TIP (DISPOSABLE) ×3
INSERT TIB 4 16XPOST STAB BRNG (Insert) IMPLANT
KNEE FEM DIST 10X3 R (Joint) ×2 IMPLANT
KNEE INSERT TIB 4X16 (Insert) ×3 IMPLANT
MANIFOLD NEPTUNE II (INSTRUMENTS) ×3 IMPLANT
PACK TOTAL KNEE CUSTOM (KITS) ×3 IMPLANT
PADDING CAST COTTON 6X4 STRL (CAST SUPPLIES) IMPLANT
PATELLA TRIATHLON SYMMETRIC IMPLANT
POSITIONER SURGICAL ARM (MISCELLANEOUS) ×3 IMPLANT
RESTRICTOR CEMENT SZ 5 C-STEM (Cement) ×2 IMPLANT
SET HNDPC FAN SPRY TIP SCT (DISPOSABLE) ×1 IMPLANT
SET PAD KNEE POSITIONER (MISCELLANEOUS) ×3 IMPLANT
SPONGE LAP 18X18 X RAY DECT (DISPOSABLE) IMPLANT
STEM CEMENTED (Stem) ×2 IMPLANT
SUT MNCRL AB 3-0 PS2 18 (SUTURE) ×3 IMPLANT
SUT VIC AB 1 CT1 36 (SUTURE) ×5 IMPLANT
SUT VIC AB 2-0 CT1 27 (SUTURE) ×9
SUT VIC AB 2-0 CT1 TAPERPNT 27 (SUTURE) ×3 IMPLANT
SUT VLOC 180 0 24IN GS25 (SUTURE) ×3 IMPLANT
SYR 50ML LL SCALE MARK (SYRINGE) ×3 IMPLANT
TOWER CARTRIDGE SMART MIX (DISPOSABLE) ×3 IMPLANT
TRIATHLON FEMORAL RIGHT ×3 IMPLANT
TRIATHLON SYMMETRIC PATELLA ×3 IMPLANT
WRAP KNEE MAXI GEL POST OP (GAUZE/BANDAGES/DRESSINGS) ×3 IMPLANT
YANKAUER SUCT BULB TIP 10FT TU (MISCELLANEOUS) ×2 IMPLANT

## 2017-04-22 NOTE — Op Note (Signed)
Kari Flores, Kari Flores              ACCOUNT NO.:  [id number]  MEDICAL RECORD NO.:  [id number]  LOCATION:  [facility]                         FACILITY:  [facility]  PHYSICIAN:  Madlyn Frankel. Charlann Boxer, M.D.  DATE OF BIRTH:  1941-03-21  DATE OF PROCEDURE:  04/22/2017 DATE OF DISCHARGE:                              OPERATIVE REPORT   PREOPERATIVE DIAGNOSIS:  Failed right total knee replacement.  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  She also had bone scan indicated questionable loosening of the femoral component.  POSTOPERATIVE DIAGNOSIS:  Failed right total knee replacement.  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  She also had bone scan indicated questionable loosening of the femoral component.  PROCEDURE:  Revision of right total knee replacement with revision of the patella and femoral components.  COMPONENTS:  The components utilized in the revision were Stryker components with a size 3 TS femoral component with 10 mm medial distal augment, 10 mm distal lateral and 15 mm distal medial and a size 12 x 100 mm cemented stem extension.  A 36 mm symmetric patella that was 10 mm thick.  I used a size 4 insert to match the tibial tray previously in place and a 16 mm insert posterior stabilized.  SURGEON:  Madlyn Frankel. Charlann Boxer, M.D.  ASSISTANT:  Lanney Gins, PA.  ANESTHESIA:  Regional plus spinal.  SPECIMENS:  None.  COMPLICATION:  None.  FINDINGS:  The patient had clear synovial fluid.  No signs of infection.  TOURNIQUET TIME:  60 minutes at 250 mmHg.  INDICATIONS FOR PROCEDURE:  Ms. Severs is a pleasant 76 year old female with a history of a right total knee replacement that has been complicated with several different issues.  She was initially seen and evaluated for a second opinion in consultation regarding a painful total knee.  At that  time, we revised the poly as well as removed some retained posterior femoral bone.  She had done well initially, but then developed stiffness and subsequently identified loosening of the tibial tray, which was revised.  The femoral component, at that time was stable; however, during the postoperative period, since this was occurred, she has developed persistent stiffness, lack of extension fully, as well as lack of flexion.  In addition, she had subsequently gone through a right total hip replacement successfully and done well with this.  At this point, however, this right knee continues to be a significant challenge for overall functional ability.  Workup was negative for infection.  Bone scan indicated uptake around the femoral component.  Given the combination of her persistent pain, failure to the regarding her stiffness, decrease her overall functional quality of life, and as well as the findings by bone scan, I discussed with her the possibility of revising the femoral components to try to work on getting her extension, debriding, and see if we can improve her overall flexion. Risks of persistent postoperative stiffness and pain were discussed and reviewed, in addition to the risk of infection, DVT, and need for future surgeries.  The postoperative course was stressed  and consent was obtained for benefit of pain relief.  PROCEDURE IN DETAIL:  The patient was brought to the operative theater. Once adequate anesthesia preoperative antibiotics, Ancef and Decadron administered as tranexamic acid was going to be administered intra- articularly.  She was positioned supine with a right thigh tourniquet placed.  The right lower extremity was then prepped and draped in sterile fashion.  Time-out was performed identifying the patient, planned procedure, and extremity.  Leg was exsanguinated.  Tourniquet elevated to 250 mmHg.  Old incision was identified and utilized.  Soft tissue planes  created.  Median arthrotomy was made and initial attention was focuses one the medial, lateral, and suprapatellar region recreating the pouches.  I used a caliper, identified that her patella thickness was 28 mm, which seemed to be excessive for a female knee.  For that reason, I decided to resect her old patella button.  The remaining bone stock through caliper measurements was approximately 13-14 mm.  I selected a 10 mm thick insert, which was a size 36 symmetric patellar button.  I put this in the superior medial aspect of the patella and removed remaining lateral bone, lug holes were drilled for this.  With retractors placed, I was able to expose the joint adequately with patellar subluxation as opposed to having to do any other augmented exposure techniques.  With the knee exposed, I used a thin ACL saw and was able to remove the femoral component without significant bone stock loss.  At this point, we decided to use a 12 mm cemented stem and thus reamed up to a 14 mm reamer.  With the reamer kept in place, I then evaluated the distal femoral cut.  I did remove minimal bone off the distal femur at this point, so I would be able to evaluate both the extension and flexion gaps.  I then evaluated the size of her distal femoral bone and initially selected a size 4 insert, which appeared to be a downsize from her previously placed component.  The anterior, posterior, and chamfer cuts were all revisited.  At this point, I performed initial trial, was a bit taken aback with this initial trial with standard (non-augmented) components in place.  There was significant hyperextension despite only debriding or resection of minimal bone off the distal femur.  I augmented up to a size 15 in order to get the knee to come to extension as well as the balance of the flexion gap.  At this point, the patella height appeared to be appropriately positioned in the box of the femoral component and was  not indicated to restore joint line.  During the trial reduction I recognized that the femoral component was sitting up a little bit more, but as opposed to placing a +2 bolt onto the stem, I downsized to a size 3 component keeping the flexion gap the same; however, bringing the anterior flange down closer to the anterior cortex of the femur.  Given all these findings, the final components were selected based on the trial reduction.  Final components were open.  The cement restrictors were placed.  The knee was irrigated with normal saline solution with pulse lavage.  The synovial capsule junction of the knee was injected with 0.25% Marcaine with epinephrine that was allowable per based on previous block and spinal.  This was all configured on the back table under my supervision.  Once the cement was mixed, the final components were then cemented into place and the knee was held  in extension using a 13 mm insert.  Once the cement had fully cured, excessive cement was removed throughout the knee.  I re-trialed and selected a 16 mm insert because it best matched the flexion gap and extension without any block of her extension.  She did have full extension at this point.  When this patella returned to anatomic position, her flexion was noted to be greater than 90 degrees at this point.  The final 16 mm posterior stabilized insert to match the 4 tibial tray was then impacted.  The knee was irrigated again.  Tourniquet was let down after 60 minutes without significant hemostasis required.  The extensor mechanism was then reapproximated using #1 Vicryl and 0 STRATAFIX.  The remainder of the wound was then closed with 2-0 Vicryl running Monocryl suture.  The skin was cleaned, dried, and dressed sterilely with surgical glue and an Aquacel dressing.  She was brought to the recovery room in stable condition.  She will be weightbearing as tolerated.  We will get a CPM to immediately work on  both extension and flexion.  We will probably discharge her home with this as well as physical therapy.     [name], M.D.     MDO/MEDQ  D:  04/22/2017  T:  04/22/2017  Job:  [id number]

## 2017-04-22 NOTE — Anesthesia Procedure Notes (Signed)
Anesthesia Regional Block: Adductor canal block   Pre-Anesthetic Checklist: ,, timeout performed, Correct Patient, Correct Site, Correct Laterality, Correct Procedure, Correct Position, site marked, Risks and benefits discussed,  Surgical consent,  Pre-op evaluation,  At surgeon's request and post-op pain management  Laterality: Right  Prep: chloraprep       Needles:  Injection technique: Single-shot  Needle Type: Echogenic Needle     Needle Length: 9cm  Needle Gauge: 21     Additional Needles:   Narrative:  Start time: 04/22/2017 7:07 AM End time: 04/22/2017 7:11 AM Injection made incrementally with aspirations every 5 mL.  Performed by: Personally  Anesthesiologist: Leslye Peer E  Additional Notes: No pain on injection. No increased resistance to injection. Injection made in 5cc increments. Good needle visualization. Patient tolerated the procedure well.

## 2017-04-22 NOTE — Anesthesia Procedure Notes (Addendum)
Procedure Name: MAC Date/Time: 04/22/2017 7:21 AM Performed by: Lissa Morales Pre-anesthesia Checklist: Patient identified, Emergency Drugs available, Suction available and Patient being monitored Patient Re-evaluated:Patient Re-evaluated prior to induction Oxygen Delivery Method: Simple face mask Airway Equipment and Method: Oral airway Placement Confirmation: positive ETCO2

## 2017-04-22 NOTE — Transfer of Care (Signed)
Immediate Anesthesia Transfer of Care Note  Patient: Kari Flores  Procedure(s) Performed: RIGHT TOTAL KNEE REVISION (Right Knee)  Patient Location: PACU  Anesthesia Type:Spinal  Level of Consciousness: awake, alert , oriented and patient cooperative  Airway & Oxygen Therapy: Patient Spontanous Breathing and Patient connected to face mask oxygen  Post-op Assessment: Report given to RN and Post -op Vital signs reviewed and stable  Post vital signs: stable  Last Vitals:  Vitals:   04/22/17 0536  BP: (!) 157/72  Pulse: 67  Resp: 16  Temp: 36.9 C  SpO2: 100%    Last Pain:  Vitals:   04/22/17 0536  TempSrc: Oral      Patients Stated Pain Goal: 4 (04/22/17 0540)  Complications: No apparent anesthesia complications

## 2017-04-22 NOTE — Anesthesia Procedure Notes (Addendum)
Spinal  Patient location during procedure: OR Start time: 04/22/2017 7:29 AM End time: 04/22/2017 7:38 AM Staffing Anesthesiologist: Beryle Lathe Resident/CRNA: Anastasio Champion E Performed: anesthesiologist  Preanesthetic Checklist Completed: patient identified, surgical consent, pre-op evaluation, timeout performed, IV checked, risks and benefits discussed and monitors and equipment checked Spinal Block Patient position: sitting Prep: Betadine Patient monitoring: heart rate, cardiac monitor, continuous pulse ox and blood pressure Approach: midline Location: L3-4 Injection technique: single-shot Needle Needle type: Pencan  Needle gauge: 24 G Additional Notes Functioning IV was confirmed and monitors were applied. Sterile prep and drape, including hand hygiene, mask, and sterile gloves were used. The patient was positioned and the spine was prepped. The skin was anesthetized with lidocaine. First attempt by CRNA with 22ga quincke, unsuccessful. Next attempt by Dr. Mal Amabile using introducer and 24ga Pencan, successful. Free flow of clear CSF was obtained prior to injecting local anesthetic into the CSF. The spinal needle aspirated freely following injection. The needle was carefully withdrawn. The patient tolerated the procedure well. Consent was obtained prior to the procedure with all questions answered and concerns addressed. Risks including, but not limited to, bleeding, infection, nerve damage, paralysis, failed block, inadequate analgesia, allergic reaction, high spinal, itching, and headache were discussed and the patient wished to proceed.  Leslye Peer, MD

## 2017-04-22 NOTE — Progress Notes (Signed)
Attempted to transfer to room, informed room not cleaned, returned to PACU and placed on Pulse Oximeter

## 2017-04-22 NOTE — Discharge Instructions (Signed)

## 2017-04-22 NOTE — Brief Op Note (Signed)
04/22/2017  9:27 AM  PATIENT:  Kari Flores  76 y.o. female  PRE-OPERATIVE DIAGNOSIS:  Failed Right total knee arthroplasty, painful right total knee  POST-OPERATIVE DIAGNOSIS:  Failed Right total knee arthroplasty, painful right total knee  PROCEDURE:  Procedure(s) with comments: RIGHT TOTAL KNEE REVISION (Right) - [employer]ryker  SURGEON:  Surgeon(s) and Role:    Durene Romans, MD - Primary  PHYSICIAN ASSISTANT: Lanney Gins, PA-C  ANESTHESIA:   regional and spinal  EBL:  Total I/O In: 1000 [I.V.:1000] Out: 375 [Urine:325; Blood:50]  BLOOD ADMINISTERED:none  DRAINS: none   LOCAL MEDICATIONS USED:  MARCAINE     SPECIMEN:  No Specimen  DISPOSITION OF SPECIMEN:  N/A  COUNTS:  YES  TOURNIQUET:   Total Tourniquet Time Documented: Thigh (Right) - 60 minutes Total: Thigh (Right) - 60 minutes   DICTATION: .Other Dictation: Dictation Number [id number]  PLAN OF CARE: Admit to inpatient   PATIENT DISPOSITION:  PACU - hemodynamically stable.   Delay start of Pharmacological VTE agent (>24hrs) due to surgical blood loss or risk of bleeding: no

## 2017-04-22 NOTE — Anesthesia Postprocedure Evaluation (Signed)
Anesthesia Post Note  Patient: Kari Flores  Procedure(s) Performed: RIGHT TOTAL KNEE REVISION (Right Knee)     Patient location during evaluation: PACU Anesthesia Type: Spinal Level of consciousness: awake and alert Pain management: pain level controlled Vital Signs Assessment: post-procedure vital signs reviewed and stable Respiratory status: spontaneous breathing and respiratory function stable Cardiovascular status: blood pressure returned to baseline and stable Postop Assessment: spinal receding and no apparent nausea or vomiting Anesthetic complications: no    Last Vitals:  Vitals:   04/22/17 1130 04/22/17 1154  BP:  (!) 142/62  Pulse: (!) 52 (!) 55  Resp:  14  Temp:  36.6 C  SpO2: 100% 100%    Last Pain:  Vitals:   04/22/17 1130  TempSrc:   PainSc: Asleep                 Beryle Lathe

## 2017-04-22 NOTE — Interval H&P Note (Signed)
History and Physical Interval Note:  04/22/2017 7:06 AM  Kari Flores  has presented today for surgery, with the diagnosis of Failed Right total knee arthroplasty, painful right total knee  The various methods of treatment have been discussed with the patient and family. After consideration of risks, benefits and other options for treatment, the patient has consented to  Procedure(s) with comments: RIGHT TOTAL KNEE REVISION (Right) - 90 mins as a surgical intervention .  The patient's history has been reviewed, patient examined, no change in status, stable for surgery.  I have reviewed the patient's chart and labs.  Questions were answered to the patient's satisfaction.     Shelda Pal

## 2017-04-22 NOTE — Evaluation (Signed)
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 (this may be the 3 R knee revision for this patient per pt)   Clinical Impression  Pt is s/p TKA resulting in the deficits listed below (see PT Problem List). Pt will benefit from acutePT to increase their independence and safety with mobility to allow discharge tto home safely. Tolerated this first session well today.      Follow Up Recommendations Outpatient PT    Equipment Recommendations  None recommended by PT    Recommendations for Other Services       Precautions / Restrictions Precautions Precautions: Knee Required Braces or Orthoses: Knee Immobilizer - Right Knee Immobilizer - Right: Discontinue once straight leg raise with < 10 degree lag;On when out of bed or walking Restrictions Weight Bearing Restrictions: No      Mobility  Bed Mobility Overal bed mobility: Needs Assistance Bed Mobility: Supine to Sit;Sit to Supine     Supine to sit: Min assist     General bed mobility comments: slight min A for RLE   Transfers Overall transfer level: Needs assistance Equipment used: Rolling walker (2 wheeled) Transfers: Sit to/from Stand Sit to Stand: Min guard         General transfer comment: very easy to rise, just reviewed safety wtih RW   Ambulation/Gait Ambulation/Gait assistance: Min guard Ambulation Distance (Feet): 60 Feet Assistive device: Rolling walker (2 wheeled) Gait Pattern/deviations: Step-to pattern     General Gait Details: educated step to pattern for POD0 to avoid buckling and pain for this session   Stairs            Wheelchair Mobility    Modified Rankin (Stroke Patients Only)       Balance                                             Pertinent Vitals/Pain  Pain Assessment: 0-10 Pain Score: 2  Pain Location: "not bad" R knee  Pain Descriptors / Indicators: Aching Pain Intervention(s): Monitored during session;Ice applied    Home Living Family/patient expects to be discharged to:: Private residence Living Arrangements: Spouse/significant other Available Help at Discharge: Family Type of Home: House Home Access: Stairs to enter Entrance Stairs-Rails: None Entrance Stairs-Number of Steps: 1 Home Layout: Able to live on main level with bedroom/bathroom Home Equipment: Walker - 2 wheels      Prior Function Level of Independence: Independent               Hand Dominance        Extremity/Trunk Assessment        Lower Extremity Assessment Lower Extremity Assessment: RLE deficits/detail RLE Deficits / Details: 5-70 supine knee flexion AAROM, and 3+/5 nee extension with ability to perform SLR  however with a little lag        Communication   Communication: No difficulties  Cognition Arousal/Alertness: Awake/alert Behavior During Therapy: WFL for tasks assessed/performed Overall Cognitive Status: Within Functional Limits for tasks assessed  General Comments      Exercises Total Joint Exercises Ankle Circles/Pumps: AROM;10 reps;Supine;Both Quad Sets: Right;Supine;AROM;10 reps Heel Slides: Right;AAROM;Supine;5 reps Straight Leg Raises: Right;AAROM;Supine;5 reps   Assessment/Plan    PT Assessment Patient needs continued PT services  PT Problem List Decreased strength;Decreased range of motion;Decreased mobility       PT Treatment Interventions Gait training;DME instruction;Therapeutic activities;Therapeutic exercise;Patient/family education;Stair training;Functional mobility training    PT Goals (Current goals can be found in the Care Plan section)  Acute Rehab PT Goals Patient Stated Goal: Iw ant this knee to do right this time  PT Goal Formulation: With  patient Time For Goal Achievement: 05/06/17 Potential to Achieve Goals: Good    Frequency 7X/week   Barriers to discharge        Co-evaluation               AM-PAC PT "6 Clicks" Daily Activity  Outcome Measure Difficulty turning over in bed (including adjusting bedclothes, sheets and blankets)?: None Difficulty moving from lying on back to sitting on the side of the bed? : Unable Difficulty sitting down on and standing up from a chair with arms (e.g., wheelchair, bedside commode, etc,.)?: A Little Help needed moving to and from a bed to chair (including a wheelchair)?: A Little Help needed walking in hospital room?: A Little Help needed climbing 3-5 steps with a railing? : A Little 6 Click Score: 17    End of Session Equipment Utilized During Treatment: Gait belt Activity Tolerance: Patient tolerated treatment well Patient left: in chair;with call bell/phone within reach;with chair alarm set Nurse Communication: Mobility status PT Visit Diagnosis: Other abnormalities of gait and mobility (R26.89)    Time: 1725-1800 PT Time Calculation (min) (ACUTE ONLY): 35 min   Charges:   PT Evaluation $PT Eval Low Complexity: 1 Low PT Treatments $Gait Training: 8-22 mins   PT G CodesMarella Bile, PT Pager: [phone] 04/22/2017   Maddelynn Moosman, Clois Dupes 04/22/2017, 10:49 PM

## 2017-04-23 ENCOUNTER — Encounter (HOSPITAL_COMMUNITY): Payer: Self-pay | Admitting: Orthopedic Surgery

## 2017-04-23 LAB — BASIC METABOLIC PANEL
Anion gap: 7 (ref 5–15)
BUN: 11 mg/dL (ref 6–20)
CHLORIDE: 109 mmol/L (ref 101–111)
CO2: 26 mmol/L (ref 22–32)
CREATININE: 0.6 mg/dL (ref 0.44–1.00)
Calcium: 8.7 mg/dL — ABNORMAL LOW (ref 8.9–10.3)
GFR calc Af Amer: >60 mL/min (ref >60)
GFR calc non Af Amer: >60 mL/min (ref >60)
Glucose, Bld: 138 mg/dL — ABNORMAL HIGH (ref 65–99)
Potassium: 4.9 mmol/L (ref 3.5–5.1)
Sodium: 142 mmol/L (ref 135–145)

## 2017-04-23 LAB — CBC
HCT: 35.4 % — ABNORMAL LOW (ref 36.0–46.0)
HEMOGLOBIN: 11.5 g/dL — AB (ref 12.0–15.0)
MCH: 29.2 pg (ref 26.0–34.0)
MCHC: 32.5 g/dL (ref 30.0–36.0)
MCV: 89.8 fL (ref 78.0–100.0)
Platelets: 183 10*3/uL (ref 150–400)
RBC: 3.94 MIL/uL (ref 3.87–5.11)
RDW: 14.9 % (ref 11.5–15.5)
WBC: 8.8 10*3/uL (ref 4.0–10.5)

## 2017-04-23 NOTE — Progress Notes (Signed)
    Durable Medical Equipment        Start     Ordered   04/22/17 1204  For Home Use Only DME CPM  Once    Question Answer Comment  Laterality Right Knee   Starting Flexion 90   Ending Flexion Max   Increase by Daily 10   Surgery Date 04/22/2017   CPM Started 04/22/2017      04/22/17 1203    contacted rep, faxed orders and demographics to [phone]. [phone]

## 2017-04-23 NOTE — Progress Notes (Signed)
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 (this may be the 3 R knee revision for this patient per pt)     PT Comments    POD # 1 am session Applied KI and instructed on use.  Assisted with amb a greater distance in hallway.  Returned to bed and performed some TKR TE's followed by ICE.   Follow Up Recommendations  Outpatient PT     Equipment Recommendations  None recommended by PT    Recommendations for Other Services       Precautions / Restrictions Precautions Precautions: Knee Precaution Comments: instructed on KI use and proper application Required Braces or Orthoses: Knee Immobilizer - Right Knee Immobilizer - Right: Discontinue once straight leg raise with < 10 degree lag;On when out of bed or walking Restrictions Weight Bearing Restrictions: No Other Position/Activity Restrictions: WBAT    Mobility  Bed Mobility Overal bed mobility: Needs Assistance Bed Mobility: Supine to Sit;Sit to Supine     Supine to sit: Supervision Sit to supine: Supervision   General bed mobility comments: pt uses opposite LE to self assist R LE  Transfers Overall transfer level: Needs assistance Equipment used: Rolling walker (2 wheeled) Transfers: Sit to/from Stand Sit to Stand: Min guard;Supervision         General transfer comment: one VC safety with turn completion in bathroom  Ambulation/Gait Ambulation/Gait assistance: Min guard;Min assist Ambulation Distance (Feet): 62 Feet Assistive device: Rolling walker (2 wheeled) Gait Pattern/deviations: Step-to pattern Gait velocity: decreased   General Gait Details: 25% VC's on proper upright posture and proper walker to self distance.    Stairs            Wheelchair Mobility    Modified Rankin (Stroke Patients  Only)       Balance                                            Cognition Arousal/Alertness: Awake/alert Behavior During Therapy: WFL for tasks assessed/performed Overall Cognitive Status: Within Functional Limits for tasks assessed                                        Exercises   Total Knee Replacement TE's 10 reps B LE ankle pumps 10 reps towel squeezes 10 reps knee presses 10 reps heel slides  10 reps SAQ's 10 reps SLR's 10 reps ABD Followed by ICE     General Comments        Pertinent Vitals/Pain Pain Assessment: 0-10 Pain Score: 2  Pain Location: R knee Pain Descriptors / Indicators: Operative site guarding;Tender Pain Intervention(s): Monitored during session;Premedicated before session;Repositioned;Ice applied    Home Living                      Prior Function            PT Goals (current goals can now be found in the care plan section) Progress towards PT goals: Progressing toward goals    Frequency    7X/week      PT Plan Current plan remains appropriate  Co-evaluation              AM-PAC PT "6 Clicks" Daily Activity  Outcome Measure  Difficulty turning over in bed (including adjusting bedclothes, sheets and blankets)?: None Difficulty moving from lying on back to sitting on the side of the bed? : Unable Difficulty sitting down on and standing up from a chair with arms (e.g., wheelchair, bedside commode, etc,.)?: A Little Help needed moving to and from a bed to chair (including a wheelchair)?: A Little Help needed walking in hospital room?: A Little Help needed climbing 3-5 steps with a railing? : A Little 6 Click Score: 17    End of Session Equipment Utilized During Treatment: Gait belt Activity Tolerance: Patient tolerated treatment well Patient left: in bed;with call bell/phone within reach Nurse Communication: Mobility status PT Visit Diagnosis: Other abnormalities of gait and  mobility (R26.89)     Time: 0930-1000 PT Time Calculation (min) (ACUTE ONLY): 30 min  Charges:  $Gait Training: 8-22 mins $Therapeutic Exercise: 8-22 mins                    G Codes:       Felecia Shelling  PTA [facility]  Acute  Rehab Pager      [phone]

## 2017-04-23 NOTE — Progress Notes (Signed)
OT Cancellation Note  Patient Details Name: Kari Flores MRN: [id number] DOB: 06/18/1941   Cancelled Treatment:    Reason Eval/Treat Not Completed: PT screened, no needs identified, will sign off  Marcio Hoque 04/23/2017, 8:19 AM  Marica Otter, OTR/L [phone] 04/23/2017

## 2017-04-23 NOTE — Progress Notes (Signed)
Patient ID: ASAL TEAS, female   DOB: July 07, 1941, 76 y.o.   MRN: [id number] Subjective: 1 Day Post-Op Procedure(s) (LRB): RIGHT TOTAL KNEE REVISION (Right)    Patient reports pain as mild to moderate, doing fairly well.  Feeling as optimistic as ever that this work out for her.  Some CPM usage last night.  Objective:   VITALS:   Vitals:   04/23/17 0510 04/23/17 1006  BP: 140/89 (!) 132/59  Pulse: 62 (!) 52  Resp: 17 18  Temp: 97.8 F (36.6 C) 97.9 F (36.6 C)  SpO2: 100% 100%    Neurovascular intact Incision: dressing C/D/I  LABS  Recent Labs  04/23/17 0540  HGB 11.5*  HCT 35.4*  WBC 8.8  PLT 183     Recent Labs  04/23/17 0540  NA 142  K 4.9  BUN 11  CREATININE 0.60  GLUCOSE 138*    No results for input(s): LABPT, INR in the last 72 hours.   Assessment/Plan: 1 Day Post-Op Procedure(s) (LRB): RIGHT TOTAL KNEE REVISION (Right)   Advance diet Up with therapy Plan for discharge tomorrow after therapy Home CPM ordered Outpt PT already set up Reviewed operative findings with her, hopeful that we will see improved ROM with this right knee

## 2017-04-23 NOTE — Progress Notes (Signed)
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 (this may be the 3 R knee revision for this patient per pt)     PT Comments    POD # 1 pm Assisted with amb in hallway a greater distance.  See mobility details below.    Follow Up Recommendations  Outpatient PT     Equipment Recommendations  None recommended by PT    Recommendations for Other Services       Precautions / Restrictions Precautions Precautions: Knee Precaution Comments: instructed on KI use and proper application Required Braces or Orthoses: Knee Immobilizer - Right Knee Immobilizer - Right: Discontinue once straight leg raise with < 10 degree lag;On when out of bed or walking Restrictions Weight Bearing Restrictions: No Other Position/Activity Restrictions: WBAT    Mobility  Bed Mobility Overal bed mobility: Needs Assistance Bed Mobility: Supine to Sit;Sit to Supine     Supine to sit: Supervision Sit to supine: Supervision   General bed mobility comments: pt uses opposite LE to self assist R LE  Transfers Overall transfer level: Needs assistance Equipment used: Rolling walker (2 wheeled) Transfers: Sit to/from Stand Sit to Stand: Min guard;Supervision         General transfer comment: one VC safety with turn completion in bathroom  Ambulation/Gait Ambulation/Gait assistance: Min guard;Min assist Ambulation Distance (Feet): 87 Feet Assistive device: Rolling walker (2 wheeled) Gait Pattern/deviations: Step-to pattern Gait velocity: decreased   General Gait Details: 25% VC's on proper upright posture and proper walker to self distance.    Stairs            Wheelchair Mobility    Modified Rankin (Stroke Patients Only)       Balance                                            Cognition Arousal/Alertness: Awake/alert Behavior During Therapy: WFL for tasks assessed/performed Overall Cognitive Status: Within Functional Limits for tasks assessed                                        Exercises      General Comments        Pertinent Vitals/Pain Pain Assessment: 0-10 Pain Score: 2  Pain Location: R knee Pain Descriptors / Indicators: Operative site guarding;Tender Pain Intervention(s): Monitored during session;Premedicated before session;Repositioned;Ice applied    Home Living                      Prior Function            PT Goals (current goals can now be found in the care plan section) Progress towards PT goals: Progressing toward goals    Frequency    7X/week      PT Plan Current plan remains appropriate    Co-evaluation              AM-PAC PT "6 Clicks" Daily Activity  Outcome Measure  Difficulty turning over in bed (including adjusting bedclothes, sheets and blankets)?: None Difficulty moving from lying on back to sitting on the side of the bed? :  Unable Difficulty sitting down on and standing up from a chair with arms (e.g., wheelchair, bedside commode, etc,.)?: A Little Help needed moving to and from a bed to chair (including a wheelchair)?: A Little Help needed walking in hospital room?: A Little Help needed climbing 3-5 steps with a railing? : A Little 6 Click Score: 17    End of Session Equipment Utilized During Treatment: Gait belt Activity Tolerance: Patient tolerated treatment well Patient left: in bed;with call bell/phone within reach Nurse Communication: Mobility status PT Visit Diagnosis: Other abnormalities of gait and mobility (R26.89)     Time: 6578-4696 PT Time Calculation (min) (ACUTE ONLY): 16 min  Charges:  $Gait Training: 8-22 mins                     G Codes:       Felecia Shelling  PTA [facility]  Acute  Rehab Pager      [phone] {

## 2017-04-23 NOTE — Progress Notes (Signed)
Disposition plan: home with spouse, Pt already has equipment, OPPT Rx given.[phone]

## 2017-04-24 LAB — BASIC METABOLIC PANEL
Anion gap: 5 (ref 5–15)
BUN: 18 mg/dL (ref 6–20)
CALCIUM: 8.6 mg/dL — AB (ref 8.9–10.3)
CO2: 27 mmol/L (ref 22–32)
CREATININE: 0.6 mg/dL (ref 0.44–1.00)
Chloride: 108 mmol/L (ref 101–111)
GFR calc non Af Amer: >60 mL/min (ref >60)
Glucose, Bld: 110 mg/dL — ABNORMAL HIGH (ref 65–99)
Potassium: 4.3 mmol/L (ref 3.5–5.1)
SODIUM: 140 mmol/L (ref 135–145)

## 2017-04-24 LAB — CBC
HCT: 33.2 % — ABNORMAL LOW (ref 36.0–46.0)
Hemoglobin: 10.9 g/dL — ABNORMAL LOW (ref 12.0–15.0)
MCH: 29.5 pg (ref 26.0–34.0)
MCHC: 32.8 g/dL (ref 30.0–36.0)
MCV: 89.7 fL (ref 78.0–100.0)
Platelets: 187 10*3/uL (ref 150–400)
RBC: 3.7 MIL/uL — ABNORMAL LOW (ref 3.87–5.11)
RDW: 15.5 % (ref 11.5–15.5)
WBC: 8.3 10*3/uL (ref 4.0–10.5)

## 2017-04-24 NOTE — Progress Notes (Signed)
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 (this may be the 3 R knee revision for this patient per pt)     PT Comments    POD # 2 Assisted with amb in hallway, practiced stairs and performes all sitting TKR TE's following HEP handout.  Pt is scheduled for OP and instructed to do HEP on days she does not go.  Instructed on use of ICE and addressed all mobility questions.  Pt ready for D/C to home.   Follow Up Recommendations  Outpatient PT     Equipment Recommendations  None recommended by PT    Recommendations for Other Services       Precautions / Restrictions Precautions Precautions: Knee Precaution Comments: instructed on KI use and proper application Required Braces or Orthoses: Knee Immobilizer - Right Knee Immobilizer - Right: Discontinue once straight leg raise with < 10 degree lag;On when out of bed or walking Restrictions Weight Bearing Restrictions: No Other Position/Activity Restrictions: WBAT    Mobility  Bed Mobility Overal bed mobility: Needs Assistance Bed Mobility: Supine to Sit;Sit to Supine     Supine to sit: Supervision Sit to supine: Supervision   General bed mobility comments: pt uses opposite LE to self assist R LE  Transfers Overall transfer level: Needs assistance   Transfers: Sit to/from Stand Sit to Stand: Supervision         General transfer comment: one VC safety with turn completion in bathroom  Ambulation/Gait Ambulation/Gait assistance: Supervision Ambulation Distance (Feet): 75 Feet Assistive device: Rolling walker (2 wheeled) Gait Pattern/deviations: Step-to pattern Gait velocity: decreased   General Gait Details: <25% VC's on proper upright posture and proper walker to self distance.    Stairs Stairs: Yes   Stair  Management: Backwards;No rails;With walker Number of Stairs: 2 General stair comments: 25% VC's on proper tech and walker placement  Wheelchair Mobility    Modified Rankin (Stroke Patients Only)       Balance                                            Cognition Arousal/Alertness: Awake/alert Behavior During Therapy: WFL for tasks assessed/performed Overall Cognitive Status: Within Functional Limits for tasks assessed                                        Exercises   10 knee bends sitting EOB   10 LAQ's sitting EOB    General Comments        Pertinent Vitals/Pain Pain Assessment: 0-10 Pain Score: 3  Pain Location: R knee Pain Descriptors / Indicators: Operative site guarding;Tender Pain Intervention(s): Monitored during session;Premedicated before session;Repositioned;Ice applied    Home Living                      Prior Function            PT Goals (current goals can now be found in the care plan section) Progress towards PT goals: Progressing toward goals    Frequency    7X/week      PT Plan Current plan  remains appropriate    Co-evaluation              AM-PAC PT "6 Clicks" Daily Activity  Outcome Measure  Difficulty turning over in bed (including adjusting bedclothes, sheets and blankets)?: None Difficulty moving from lying on back to sitting on the side of the bed? : Unable Difficulty sitting down on and standing up from a chair with arms (e.g., wheelchair, bedside commode, etc,.)?: A Little Help needed moving to and from a bed to chair (including a wheelchair)?: A Little Help needed walking in hospital room?: A Little Help needed climbing 3-5 steps with a railing? : A Little 6 Click Score: 17    End of Session         PT Visit Diagnosis: Other abnormalities of gait and mobility (R26.89)     Time: 1005-1030 PT Time Calculation (min) (ACUTE ONLY): 25 min  Charges:  $Gait Training: 8-22  mins $Therapeutic Exercise: 8-22 mins                    G Codes:       Kari Flores  PTA [facility]  Acute  Rehab Pager      [phone]

## 2017-04-24 NOTE — Progress Notes (Signed)
Patient ID: Kari MurrainMary N Flores, female   DOB: 08-11-1940, 76 y.o.   MRN: [id number] Subjective: 2 Days Post-Op Procedure(s) (LRB): RIGHT TOTAL KNEE REVISION (Right)    Patient reports pain as mild.  Did well with therapy yesterday, ready to go home.  No events  Objective:   VITALS:   Vitals:   04/23/17 2035 04/24/17 0505  BP: (!) 106/54 (!) 153/73  Pulse: (!) 52 63  Resp:    Temp: 98.1 F (36.7 C) (!) 97.5 F (36.4 C)  SpO2: 100% 100%    Neurovascular intact Incision: dressing C/D/I  LABS  Recent Labs  04/23/17 0540 04/24/17 0626  HGB 11.5* 10.9*  HCT 35.4* 33.2*  WBC 8.8 8.3  PLT 183 187     Recent Labs  04/23/17 0540 04/24/17 0626  NA 142 140  K 4.9 4.3  BUN 11 18  CREATININE 0.60 0.60  GLUCOSE 138* 110*    No results for input(s): LABPT, INR in the last 72 hours.   Assessment/Plan: 2 Days Post-Op Procedure(s) (LRB): RIGHT TOTAL KNEE REVISION (Right)   Up with therapy  Home today after therapy Home with CPM and outpt PT RTC in 2 weeks for routine follow up

## 2017-04-30 NOTE — Discharge Summary (Signed)
Physician Discharge Summary  Patient ID: Kari Flores MRN: [id number] DOB/AGE: 1941-01-19 76 y.o.  Admit date: 04/22/2017 Discharge date: 04/24/2017   Procedures:  Procedure(s) (LRB): RIGHT TOTAL KNEE REVISION (Right)  Attending Physician:  Dr. Durene Romans   Admission Diagnoses:   Failed right TKA  Discharge Diagnoses:  Principal Problem:   S/P right TK revision  Past Medical History:  Diagnosis Date  . Arthritis   . GERD (gastroesophageal reflux disease)   . H/O blood clots    after knee surgery -1 st surgery right knee-in lower leg  . Hypertension   . Stroke [facility]) 10-12 yrs ago   no deficits-on Aggrenox- sometime before first knee surgery    HPI:    Kari Flores, 76 y.o. female, has a history of pain and functional disability in the right knee(s) due to failed previous arthroplasty and patient has failed non-surgical conservative treatments for greater than 12 weeks to include NSAID's and/or analgesics and activity modification. The indications for the revision of the total knee arthroplasty are bearing surface wear leading to symptomatic synovitis and implant or knee misalignment. Onset of symptoms was gradual starting  years ago with gradually worsening course since that time.  Prior procedures on the right knee(s) include arthroplasty and and revision arthroplasty. Patient currently rates pain in the right knee(s) at 10 out of 10 with activity. There is night pain, worsening of pain with activity and weight bearing, pain that interferes with activities of daily living, pain with passive range of motion, crepitus and joint swelling.  Patient has evidence of previous arthroplasty by imaging studies. This condition presents safety issues increasing the risk of falls.   There is no current active infection.  Risks, benefits and expectations were discussed with the patient.  Risks including but not limited to the risk of anesthesia, blood clots, nerve damage, blood vessel  damage, failure of the prosthesis, infection and up to and including death.  Patient understand the risks, benefits and expectations and wishes to proceed with surgery.   PCP: Deloris Ping, MD   Discharged Condition: good  Hospital Course:  Patient underwent the above stated procedure on 04/22/2017. Patient tolerated the procedure well and brought to the recovery room in good condition and subsequently to the floor.  POD #1 BP: 132/59 ; Pulse: 52 ; Temp: 97.9 F (36.6 C) ; Resp: 18 Patient reports pain as mild to moderate, doing fairly well.  Feeling as optimistic as ever that this work out for her.  Some CPM usage last night. Neurovascular intact and incision: dressing C/D/I.  LABS  Basename    HGB     11.5  HCT     35.4   POD #2  BP: 153/73 ; Pulse: 63 ; Temp: 97.5 F (36.4 C) ; Resp: 18 Patient reports pain as mild.  Did well with therapy yesterday, ready to go home.  No events. Neurovascular intact and incision: dressing C/D/I.  LABS  Basename    HGB     10.9  HCT     33.2    Discharge Exam: General appearance: alert, cooperative and no distress Extremities: Homans sign is negative, no sign of DVT, no edema, redness or tenderness in the calves or thighs and no ulcers, gangrene or trophic changes  Disposition: Home with follow up in 2 weeks   Follow-up Information    Durene Romans, MD. Schedule an appointment as soon as possible for a visit in 2 week(s).   Specialty:  Orthopedic  Surgery Contact information: [address] [place] [address] [phone]           Discharge Instructions    Call MD / Call 911    Complete by:  As directed    If you experience chest pain or shortness of breath, CALL 911 and be transported to the hospital emergency room.  If you develope a fever above 101 F, pus (white drainage) or increased drainage or redness at the wound, or calf pain, call your surgeon's office.   Change dressing    Complete by:  As  directed    Maintain surgical dressing until follow up in the clinic. If the edges start to pull up, may reinforce with tape. If the dressing is no longer working, may remove and cover with gauze and tape, but must keep the area dry and clean.  Call with any questions or concerns.   Constipation Prevention    Complete by:  As directed    Drink plenty of fluids.  Prune juice may be helpful.  You may use a stool softener, such as Colace (over the counter) 100 mg twice a day.  Use MiraLax (over the counter) for constipation as needed.   Diet - low sodium heart healthy    Complete by:  As directed    Discharge instructions    Complete by:  As directed    Maintain surgical dressing until follow up in the clinic. If the edges start to pull up, may reinforce with tape. If the dressing is no longer working, may remove and cover with gauze and tape, but must keep the area dry and clean.  [facility] Call with any questions or concerns.   Increase activity slowly as tolerated    Complete by:  As directed    Weight bearing as tolerated with assist device (walker, cane, etc) as directed, use it as long as suggested by your surgeon or therapist, typically at least 4-6 weeks.   TED hose    Complete by:  As directed    Use stockings (TED hose) for 2 weeks on both leg(s).  You may remove them at night for sleeping.      Allergies as of 04/24/2017   No Known Allergies     Medication List    STOP taking these medications   acetaminophen 500 MG tablet Commonly known as:  TYLENOL     TAKE these medications   amLODipine 2.5 MG tablet Commonly known as:  NORVASC Take 2.5 mg by mouth every evening.   amoxicillin 500 MG capsule Commonly known as:  AMOXIL Take 2,000 mg by mouth See admin instructions. Take 4 capsules (2000 mg) prior to dental procedures   atorvastatin 20 MG tablet Commonly known as:  LIPITOR Take 20 mg by mouth at bedtime.     dipyridamole-aspirin 200-25 MG 12hr capsule Commonly known as:  AGGRENOX Take 1 capsule by mouth 2 (two) times daily. Start the day after finishing the Xarelto. What changed:  additional instructions   docusate sodium 100 MG capsule Commonly known as:  COLACE Take 1 capsule (100 mg total) by mouth 2 (two) times daily.   DRY EYES OP Place 1-2 drops into both eyes as needed (for dry eyes).   ferrous sulfate 325 (65 FE) MG tablet Commonly known as:  FERROUSUL Take 1 tablet (325 mg total) by mouth 3 (three) times daily with meals. What changed:  when to take this   hydrochlorothiazide  25 MG tablet Commonly known as:  HYDRODIURIL Take 12.5 mg by mouth every morning.   HYDROcodone-acetaminophen 7.5-325 MG tablet Commonly known as:  NORCO Take 1-2 tablets by mouth every 4 (four) hours as needed for moderate pain or severe pain.   methocarbamol 500 MG tablet Commonly known as:  ROBAXIN Take 1 tablet (500 mg total) by mouth every 6 (six) hours as needed for muscle spasms.   metoprolol succinate 100 MG 24 hr tablet Commonly known as:  TOPROL-XL Take 50-100 mg by mouth See admin instructions. Take 100 mg by mouth in the morning and take 50 mg by mouth at night   polyethylene glycol packet Commonly known as:  MIRALAX / GLYCOLAX Take 17 g by mouth 2 (two) times daily. What changed:  when to take this  reasons to take this   potassium chloride 10 MEQ tablet Commonly known as:  K-DUR Take 10 mEq by mouth 2 (two) times daily.            Discharge Care Instructions        Start     Ordered   04/24/17 0000  Change dressing    Comments:  Maintain surgical dressing until follow up in the clinic. If the edges start to pull up, may reinforce with tape. If the dressing is no longer working, may remove and cover with gauze and tape, but must keep the area dry and clean.  Call with any questions or concerns.   04/24/17 1914       Signed: Anastasio Auerbach. Ronnesha Mester    PA-C  04/30/2017, 10:04 AM

## 2019-01-29 IMAGING — DX DG HIP (WITH OR WITHOUT PELVIS) 1V PORT*R*
2 series · 2 of 2 positions shown · IV contrast (agent unspecified)
Comparison: No prior .

CLINICAL DATA: Total right hip revision.

EXAM:
DG C-ARM 61-120 MIN-NO REPORT; DG HIP (WITH OR WITHOUT PELVIS) 1V
PORT RIGHT
CONTRAST:  None.
FLUOROSCOPY TIME:  Fluoroscopy Time:  0 minutes 18 seconds.
Number of Acquired Spot Images: 2

[pelvis ap]
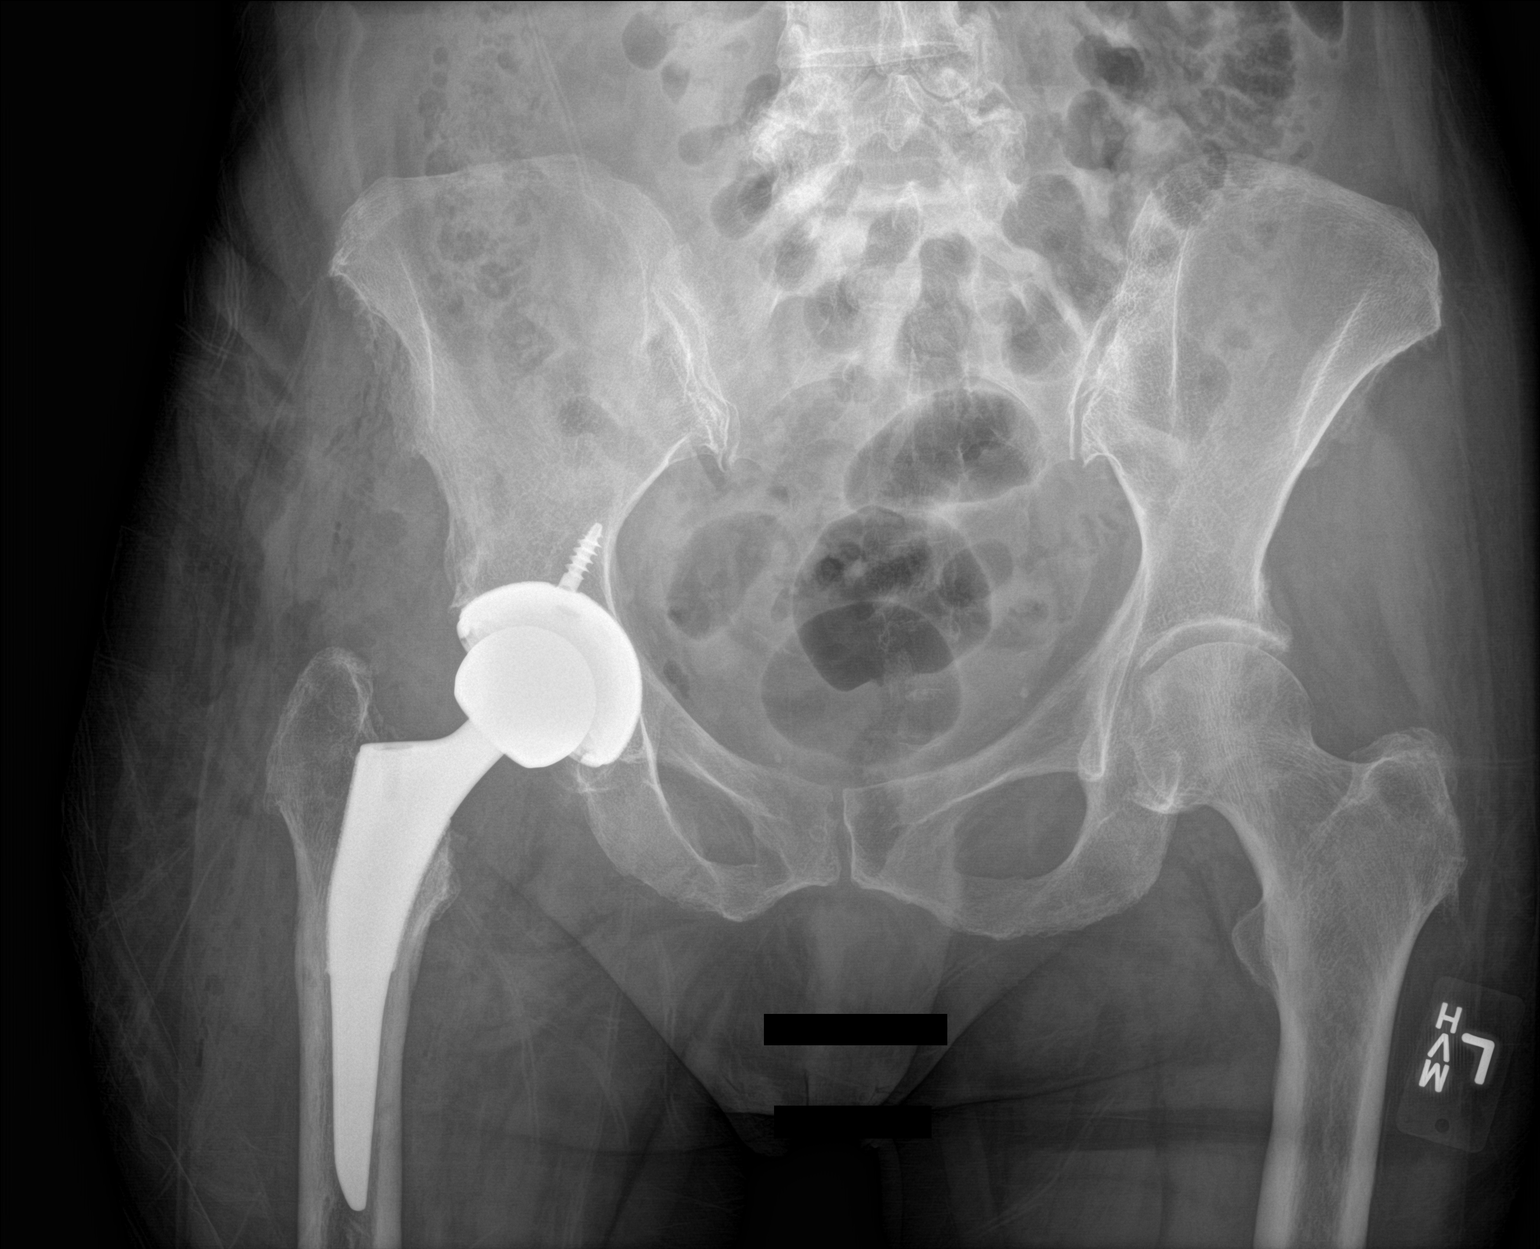

[hip lat]
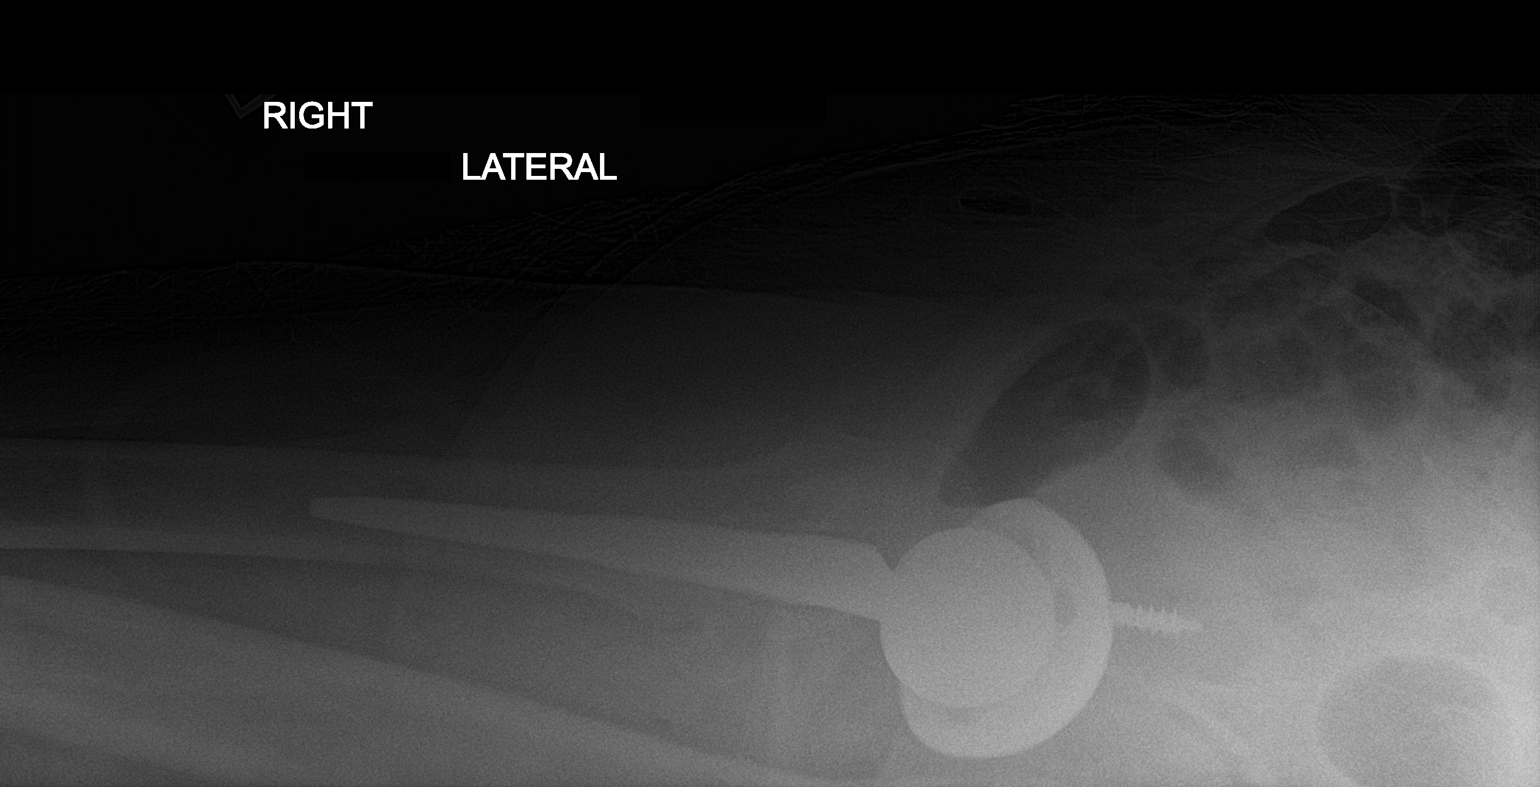

[2 of 2 positions shown; findings below may reference images not displayed]

FINDINGS: Total right hip replacement. Hardware intact. Anatomic alignment.
Degenerative changes lumbar spine left hip. Pelvic calcifications
consistent phleboliths.
IMPRESSION: Total right hip replacement with anatomic alignment.
